# Patient Record
Sex: Female | Born: 2000
Health system: Southern US, Community
[De-identification: ages and names within clinical notes are randomized; demographics above are authoritative.]

## PROBLEM LIST (undated history)

## (undated) DIAGNOSIS — I781 Nevus, non-neoplastic: Secondary | ICD-10-CM

## (undated) DIAGNOSIS — J45909 Unspecified asthma, uncomplicated: Secondary | ICD-10-CM

## (undated) HISTORY — PX: TONSILLECTOMY: SUR1361

---
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
## 2019-01-08 ENCOUNTER — Encounter: Payer: Self-pay | Admitting: Physical Therapy

## 2019-01-08 ENCOUNTER — Ambulatory Visit: Payer: 59 | Attending: Neurology | Admitting: Physical Therapy

## 2019-01-08 ENCOUNTER — Other Ambulatory Visit: Payer: Self-pay

## 2019-01-08 DIAGNOSIS — R252 Cramp and spasm: Secondary | ICD-10-CM | POA: Diagnosis present

## 2019-01-08 DIAGNOSIS — G44311 Acute post-traumatic headache, intractable: Secondary | ICD-10-CM | POA: Insufficient documentation

## 2019-01-08 DIAGNOSIS — F0781 Postconcussional syndrome: Secondary | ICD-10-CM | POA: Diagnosis present

## 2019-01-08 DIAGNOSIS — R262 Difficulty in walking, not elsewhere classified: Secondary | ICD-10-CM | POA: Insufficient documentation

## 2019-01-08 DIAGNOSIS — M542 Cervicalgia: Secondary | ICD-10-CM | POA: Insufficient documentation

## 2019-01-08 NOTE — Therapy (Signed)
[place], [place], [address] Phone: [phone]   Fax:  [phone]  Physical Therapy Evaluation  Patient Details  Name: Candace King MRN: [id number] Date of Birth: Jun 09, 2000 Referring Provider (PT): Valaria Good   Encounter Date: 01/08/2019  PT End of Session - 01/08/19 1151    Visit Number  1    Date for PT Re-Evaluation  03/10/18    PT Start Time  1100    PT Stop Time  1200    PT Time Calculation (min)  60 min    Activity Tolerance  Patient tolerated treatment well    Behavior During Therapy  [facility] for tasks assessed/performed       Past Medical History:  Diagnosis Date  . Asthma   . Telangiectasia     Past Surgical History:  Procedure Laterality Date  . TONSILLECTOMY      There were no vitals filed for this visit.   Subjective Assessment - 01/08/19 1109    Subjective  Patient reports two falls when she slipped and fell, one time she hit her head and the next day a sheet of ice fell on her head, she reports had a CT scan and will have an MRI on 01/13/19.  She reports that she has neck pain, HA and dizziness.  She reports that it is worse with bending forward.  She reports that she had some photosensitivity and some loud noise aversion.    Limitations  Walking;House hold activities    Patient Stated Goals  walk better, less neck pain and HA, less dizziness    Currently in Pain?  Yes    Pain Score  7     Pain Location  Neck    Pain Orientation  Left    Pain Descriptors / Indicators  Aching;Sharp    Pain Type  Acute pain    Pain Radiating Towards  patient does describe HA frontal and parietal area, c/o pressure and tightness    Pain Onset  More than a month ago    Pain Frequency  Constant    Aggravating Factors   constant pain, bright light, noise, bending over, movements of the head, pain can be 9/10    Pain Relieving Factors  dark room, rest, lie down, tylenol  at best pain a 5/10     Effect of Pain on Daily Activities  difficulty walking, overwhelmed easily, difficulty concentrating         [facility] PT Assessment - 01/08/19 0001      Assessment   Medical Diagnosis  neck pain, post concussion syndrome    Referring Provider (PT)  Tonuzzi    Onset Date/Surgical Date  12/05/18    Hand Dominance  Right    Prior Therapy  no      Precautions   Precautions  None      Balance Screen   Has the patient fallen in the past 6 months  Yes    How many times?  2    Has the patient had a decrease in activity level because of a fear of falling?   Yes    Is the patient reluctant to leave their home because of a fear of falling?   Yes      Home Environment   Additional Comments  has stairs, does some housework      Prior Function   Level of Independence  Independent with community mobility with device    Vocation  Student    Leisure  some exercise      Posture/Postural Control   Posture Comments  fwd head, rounded shoulders      ROM / Strength   AROM / PROM / Strength  AROM;Strength      AROM   Overall AROM Comments  Cervical ROM is very tight and decreaed 75% with c/o neck pain and HA, shoulder ROM limited with pain inthe neck and the HA    AROM Assessment Site  Shoulder    Right/Left Shoulder  Right;Left    Right Shoulder Flexion  130 Degrees    Right Shoulder ABduction  120 Degrees    Right Shoulder Internal Rotation  30 Degrees    Right Shoulder External Rotation  50 Degrees    Left Shoulder Flexion  130 Degrees    Left Shoulder ABduction  120 Degrees    Left Shoulder Internal Rotation  30 Degrees    Left Shoulder External Rotation  50 Degrees      Strength   Overall Strength Comments  Elbows and shoulders 3+/5 with pain int he neck and increased HA, LE's 3+/5 with HA and neck pain      Flexibility   Soft Tissue Assessment /Muscle Length  yes    Hamstrings  tight    Piriformis  tight      Palpation   Palpation comment  very tight in the upper traps and  the cervical area and into the rhomboids,she is tender in these areas as well      Transfers   Comments  unable to stand without using her hands      Ambulation/Gait   Gait Comments  using a SPC, slow and seems to have some ataxic motions of the LE's, they report that she was having no issues with walking prior to the fall and the hit to her head                Objective measurements completed on examination: See above findings.      [facility] Adult PT Treatment/Exercise - 01/08/19 0001      Modalities   Modalities  Cryotherapy;Electrical Stimulation;Moist Heat      Moist Heat Therapy   Number Minutes Moist Heat  10 Minutes    Moist Heat Location  Cervical      Cryotherapy   Number Minutes Cryotherapy  10 Minutes    Cryotherapy Location  Other (comment)   back of head   Type of Cryotherapy  Ice pack      Electrical Stimulation   Electrical Stimulation Location  to the upper traps and into the cervical area    Electrical Stimulation Action  IFC    Electrical Stimulation Parameters  supine    Electrical Stimulation Goals  Pain             PT Education - 01/08/19 1150    Education Details  cervical and scapular retraction, shrugs and gentle upper trap streches    Person(s) Educated  Patient;Parent(s)    Methods  Explanation;Demonstration;Tactile cues;Verbal cues;Handout    Comprehension  Verbalized understanding       PT Short Term Goals - 01/08/19 1231      PT SHORT TERM GOAL #1   Title  independent with initial HEP    Time  2    Period  Weeks    Status  New        PT Long Term Goals - 01/08/19 1231      PT LONG  TERM GOAL #1   Title  decrease pain 50%    Time  8    Period  Weeks    Status  New      PT LONG TERM GOAL #2   Title  increase cervical ROM 50%    Time  8    Period  Weeks    Status  New      PT LONG TERM GOAL #3   Title  decrease neck pain 50%    Time  8    Period  Weeks    Status  New      PT LONG TERM GOAL #4   Title   decrease HA frequency 50%    Time  8    Period  Weeks    Status  New      PT LONG TERM GOAL #5   Title  walk without device wihtout deviation    Time  8    Period  Weeks    Status  New             Plan - 01/08/19 1151    Clinical Impression Statement  Patient reports that she had two falls one where she hit her head and then ice fell and hit her head in a two day period 12/05/18.  She reports that she has had HA, neck pain, dizziness, light and sound sensitivity, difficulty concentrating and difficulty walking due to very weak legs since that time.  She had to use hands to stand up, she walks with a SPC very uncoordinated LE's, she is very limited in cervical and shoulder ROM with increase neck pain and HA symptoms.  MMT is weak but also caused increased HA.    Stability/Clinical Decision Making  Evolving/Moderate complexity    Clinical Decision Making  Moderate    Rehab Potential  Good    PT Frequency  2x / week    PT Duration  8 weeks    PT Treatment/Interventions  ADLs/Self Care Home Management;Cryotherapy;Electrical Stimulation;Moist Heat;Traction;Ultrasound;Therapeutic activities;Therapeutic exercise;Balance training;Neuromuscular re-education;Manual techniques;Patient/family education;Dry needling    PT Next Visit Plan  patietn has post concussive syndrome, will need to go slow with any exercises, watch her HR to see if this causes her symptoms as well    Consulted and Agree with Plan of Care  Patient       Patient will benefit from skilled therapeutic intervention in order to improve the following deficits and impairments:  Abnormal gait, Dizziness, Improper body mechanics, Pain, Decreased mobility, Increased muscle spasms, Postural dysfunction, Decreased activity tolerance, Decreased endurance, Decreased range of motion, Decreased strength, Impaired UE functional use, Decreased balance, Difficulty walking, Impaired flexibility  Visit Diagnosis: Cervicalgia - Plan: PT plan  of care cert/re-cert  Intractable acute post-traumatic headache - Plan: PT plan of care cert/re-cert  Cramp and spasm - Plan: PT plan of care cert/re-cert  Difficulty in walking, not elsewhere classified - Plan: PT plan of care cert/re-cert  Post concussion syndrome - Plan: PT plan of care cert/re-cert     Problem List There are no active problems to display for this patient.   Sumner Boast., PT 01/08/2019, 12:35 PM  [place] [address] [place], [place], [address] Phone: [phone]   Fax:  [phone]  Name: Candace King MRN: [id number] Date of Birth: Jun 24, 2000

## 2019-01-14 ENCOUNTER — Ambulatory Visit: Payer: 59 | Admitting: Physical Therapy

## 2019-01-14 ENCOUNTER — Other Ambulatory Visit: Payer: Self-pay

## 2019-01-14 DIAGNOSIS — M542 Cervicalgia: Secondary | ICD-10-CM | POA: Diagnosis not present

## 2019-01-14 DIAGNOSIS — R262 Difficulty in walking, not elsewhere classified: Secondary | ICD-10-CM

## 2019-01-14 NOTE — Therapy (Signed)
[place], [place], [address] Phone: [phone]   Fax:  [phone]  Physical Therapy Treatment  Patient Details  Name: Candace King MRN: [id number] Date of Birth: 07-22-2000 Referring Provider (PT): Valaria Good   Encounter Date: 01/14/2019  PT End of Session - 01/14/19 1649    Visit Number  2    Date for PT Re-Evaluation  03/10/18    PT Start Time  1602    PT Stop Time  1655    PT Time Calculation (min)  53 min       Past Medical History:  Diagnosis Date  . Asthma   . Telangiectasia     Past Surgical History:  Procedure Laterality Date  . TONSILLECTOMY      There were no vitals filed for this visit.  Subjective Assessment - 01/14/19 1605    Subjective  "doing alright"    Limitations  Walking;House hold activities    Patient Stated Goals  walk better, less neck pain and HA, less dizziness    Currently in Pain?  Yes    Pain Score  4     Pain Location  Head                       [facility] Adult PT Treatment/Exercise - 01/14/19 0001      Exercises   Exercises  Shoulder;Knee/Hip      Knee/Hip Exercises: Aerobic   Nustep  L3 x 1 min      Knee/Hip Exercises: Seated   Long Arc Quad  Strengthening;Both;10 reps    Other Seated Knee/Hip Exercises  marching x 10, no weight    Hamstring Curl  Strengthening;Both;5 reps    Hamstring Limitations  red theraband    Sit to Sand  1 set;with UE support      Shoulder Exercises: Seated   Row  Both;10 reps;Theraband    Theraband Level (Shoulder Row)  Level 2 (Red)      Moist Heat Therapy   Number Minutes Moist Heat  15 Minutes    Moist Heat Location  Cervical      Cryotherapy   Number Minutes Cryotherapy  10 Minutes    Cryotherapy Location  Other (comment)    Type of Cryotherapy  Ice pack      Electrical Stimulation   Electrical Stimulation Location  to the upper traps and into the cervical area    Electrical  Stimulation Action  IFC    Electrical Stimulation Parameters  supine    Electrical Stimulation Goals  Pain               PT Short Term Goals - 01/08/19 1231      PT SHORT TERM GOAL #1   Title  independent with initial HEP    Time  2    Period  Weeks    Status  New        PT Long Term Goals - 01/08/19 1231      PT LONG TERM GOAL #1   Title  decrease pain 50%    Time  8    Period  Weeks    Status  New      PT LONG TERM GOAL #2   Title  increase cervical ROM 50%    Time  8    Period  Weeks    Status  New      PT LONG TERM GOAL #3  Title  decrease neck pain 50%    Time  8    Period  Weeks    Status  New      PT LONG TERM GOAL #4   Title  decrease HA frequency 50%    Time  8    Period  Weeks    Status  New      PT LONG TERM GOAL #5   Title  walk without device wihtout deviation    Time  8    Period  Weeks    Status  New            Plan - 01/14/19 1644    Clinical Impression Statement  pt' HR was 86 bpm at the start of the session. pt attempted to warm up on nustep. she had to stop due to dizziness (HR was 94). pt able to complete LAQ, HS, and marching without weights, however they all gave her a headache. she is pt unable to complete STS due to dizziness. pt attempted rows but was unable to finish due to complaints of pain.    Stability/Clinical Decision Making  Evolving/Moderate complexity    Rehab Potential  Good    PT Frequency  2x / week    PT Duration  8 weeks    PT Treatment/Interventions  ADLs/Self Care Home Management;Cryotherapy;Electrical Stimulation;Moist Heat;Traction;Ultrasound;Therapeutic activities;Therapeutic exercise;Balance training;Neuromuscular re-education;Manual techniques;Patient/family education;Dry needling    PT Next Visit Plan  patietn has post concussive syndrome, will need to go slow with any exercises, watch her HR to see if this causes her symptoms as well    Consulted and Agree with Plan of Care  Patient        Patient will benefit from skilled therapeutic intervention in order to improve the following deficits and impairments:  Abnormal gait, Dizziness, Improper body mechanics, Pain, Decreased mobility, Increased muscle spasms, Postural dysfunction, Decreased activity tolerance, Decreased endurance, Decreased range of motion, Decreased strength, Impaired UE functional use, Decreased balance, Difficulty walking, Impaired flexibility  Visit Diagnosis: Cervicalgia  Difficulty in walking, not elsewhere classified     Problem List There are no active problems to display for this patient.   [address], [place] 01/14/2019, 4:51 PM  [place], [place], [address] Phone: [phone]   Fax:  [phone]  Name: Candace King MRN: [id number] Date of Birth: 12/14/2000

## 2019-01-15 ENCOUNTER — Ambulatory Visit: Payer: 59 | Admitting: Physical Therapy

## 2019-01-15 DIAGNOSIS — M542 Cervicalgia: Secondary | ICD-10-CM | POA: Diagnosis not present

## 2019-01-15 DIAGNOSIS — R252 Cramp and spasm: Secondary | ICD-10-CM

## 2019-01-15 NOTE — Therapy (Signed)
[place], [place], [address] Phone: [phone]   Fax:  [phone]  Physical Therapy Treatment  Patient Details  Name: Candace King MRN: [id number] Date of Birth: 29-Jan-2001 Referring Provider (PT): Valaria Good   Encounter Date: 01/15/2019  PT End of Session - 01/15/19 1739    Visit Number  3    Date for PT Re-Evaluation  03/10/18    PT Start Time  1700    PT Stop Time  [id number]    PT Time Calculation (min)  53 min       Past Medical History:  Diagnosis Date  . Asthma   . Telangiectasia     Past Surgical History:  Procedure Laterality Date  . TONSILLECTOMY      There were no vitals filed for this visit.  Subjective Assessment - 01/15/19 1701    Subjective  "doing good, a little tired"    Limitations  Walking;House hold activities    Patient Stated Goals  walk better, less neck pain and HA, less dizziness    Currently in Pain?  Yes    Pain Score  5     Pain Location  Head    Pain Orientation  Anterior                       [facility] Adult PT Treatment/Exercise - 01/15/19 0001      Knee/Hip Exercises: Supine   Quad Sets  Strengthening;Both;5 reps    Heel Slides  Strengthening;Both;5 reps    Straight Leg Raises  Strengthening;Both;5 reps    Other Supine Knee/Hip Exercises  K2C and LTR 2 x 10      Moist Heat Therapy   Number Minutes Moist Heat  15 Minutes    Moist Heat Location  Cervical      Cryotherapy   Number Minutes Cryotherapy  10 Minutes    Cryotherapy Location  Other (comment)    Type of Cryotherapy  Ice pack      Electrical Stimulation   Electrical Stimulation Location  to the upper traps and into the cervical area    Electrical Stimulation Action  IFC    Electrical Stimulation Parameters  supine    Electrical Stimulation Goals  Pain             PT Education - 01/15/19 1742    Education Details  given information on TENS    Person(s)  Educated  Patient;Parent(s)    Methods  Explanation;Handout    Comprehension  Verbalized understanding       PT Short Term Goals - 01/15/19 1741      PT SHORT TERM GOAL #1   Title  independent with initial HEP    Status  Achieved        PT Long Term Goals - 01/15/19 1741      PT LONG TERM GOAL #1   Title  decrease pain 50%    Status  On-going      PT LONG TERM GOAL #2   Title  increase cervical ROM 50%    Status  On-going      PT LONG TERM GOAL #3   Title  decrease neck pain 50%    Status  On-going      PT LONG TERM GOAL #4   Title  decrease HA frequency 50%    Status  On-going      PT LONG TERM GOAL #5  Title  walk without device wihtout deviation    Status  On-going            Plan - 01/15/19 1742    Clinical Impression Statement  pt did SLR on both legs with limited motion due to weakness. pt states her head hurts with STR and quad sets. pt has less headache pain with LTR and K2C. pt and mother were given information of TENS for home. checking on coverage from insurance. pt needs frequent breaks due to pain. resting seems to allivate pain for a short time.    Stability/Clinical Decision Making  Evolving/Moderate complexity    Rehab Potential  Good    PT Frequency  2x / week    PT Duration  8 weeks    PT Treatment/Interventions  ADLs/Self Care Home Management;Cryotherapy;Electrical Stimulation;Moist Heat;Traction;Ultrasound;Therapeutic activities;Therapeutic exercise;Balance training;Neuromuscular re-education;Manual techniques;Patient/family education;Dry needling    PT Next Visit Plan  patietn has post concussive syndrome, will need to go slow with any exercises, watch her HR to see if this causes her symptoms as well       Patient will benefit from skilled therapeutic intervention in order to improve the following deficits and impairments:  Abnormal gait, Dizziness, Improper body mechanics, Pain, Decreased mobility, Increased muscle spasms, Postural  dysfunction, Decreased activity tolerance, Decreased endurance, Decreased range of motion, Decreased strength, Impaired UE functional use, Decreased balance, Difficulty walking, Impaired flexibility  Visit Diagnosis: Cervicalgia  Cramp and spasm     Problem List There are no problems to display for this patient.   [address], [place] 01/15/2019, 5:47 PM  [place], [place], [address] Phone: [phone]   Fax:  [phone]  Name: Candace King MRN: [id number] Date of Birth: 07-18-2000

## 2019-01-20 ENCOUNTER — Encounter: Payer: Self-pay | Admitting: Physical Therapy

## 2019-01-20 ENCOUNTER — Other Ambulatory Visit: Payer: Self-pay

## 2019-01-20 ENCOUNTER — Ambulatory Visit: Payer: 59 | Admitting: Physical Therapy

## 2019-01-20 DIAGNOSIS — M542 Cervicalgia: Secondary | ICD-10-CM

## 2019-01-20 DIAGNOSIS — F0781 Postconcussional syndrome: Secondary | ICD-10-CM

## 2019-01-20 DIAGNOSIS — R252 Cramp and spasm: Secondary | ICD-10-CM

## 2019-01-20 DIAGNOSIS — R262 Difficulty in walking, not elsewhere classified: Secondary | ICD-10-CM

## 2019-01-20 DIAGNOSIS — G44311 Acute post-traumatic headache, intractable: Secondary | ICD-10-CM

## 2019-01-20 NOTE — Therapy (Signed)
[place], [place], [address] Phone: [phone]   Fax:  [phone]  Physical Therapy Treatment  Patient Details  Name: Candace King MRN: [id number] Date of Birth: [birth year] Referring Provider (PT): Valaria Good   Encounter Date: 01/20/2019  PT End of Session - 01/20/19 1142    Visit Number  4    Date for PT Re-Evaluation  03/10/18    PT Start Time  1104    PT Stop Time  [id number]    PT Time Calculation (min)  48 min    Activity Tolerance  Patient limited by pain;Patient limited by fatigue    Behavior During Therapy  [facility] for tasks assessed/performed       Past Medical History:  Diagnosis Date  . Asthma   . Telangiectasia     Past Surgical History:  Procedure Laterality Date  . TONSILLECTOMY      There were no vitals filed for this visit.  Subjective Assessment - 01/20/19 1112    Subjective  I am feeling a little better, just worse as the day goes on.    Currently in Pain?  Yes    Pain Score  4     Pain Location  Head    Pain Descriptors / Indicators  Aching    Aggravating Factors   moving, reports that her pain can be a 2/10 with rest, but getting on to our mat table will increase pain and sitting wihtout back supported will increase the pain                       [facility] Adult PT Treatment/Exercise - 01/20/19 0001      Knee/Hip Exercises: Aerobic   Other Aerobic  UBE level 1 x 2 minutes      Knee/Hip Exercises: Seated   Ball Squeeze  15 reps      Knee/Hip Exercises: Supine   Quad Sets  Both;2 sets;10 reps    Short Arc Quad Sets  Both;2 sets;10 reps    Other Supine Knee/Hip Exercises  feet on ball K2C, trunk rotation, small bridge, small isometric abs      Shoulder Exercises: Supine   External Rotation  Both;10 reps;Theraband    Theraband Level (Shoulder External Rotation)  Level 1 (Yellow)      Moist Heat Therapy   Number Minutes Moist Heat  15 Minutes    Moist Heat Location  Cervical      Cryotherapy   Number Minutes Cryotherapy  10 Minutes    Cryotherapy Location  Other (comment)   head   Type of Cryotherapy  Ice pack      Electrical Stimulation   Electrical Stimulation Location  to the upper traps and into the cervical area    Electrical Stimulation Action  IFC    Electrical Stimulation Parameters  supine    Electrical Stimulation Goals  Pain               PT Short Term Goals - 01/15/19 1741      PT SHORT TERM GOAL #1   Title  independent with initial HEP    Status  Achieved        PT Long Term Goals - 01/20/19 1145      PT LONG TERM GOAL #1   Title  decrease pain 50%    Status  On-going      PT LONG TERM GOAL #2   Title  increase cervical ROM 50%    Status  On-going            Plan - 01/20/19 1143    Clinical Impression Statement  Patient walked in looking better and reported less pain, once she started moving she reported some increased pain and some increased dizziness, most exercises were her in supine.    PT Next Visit Plan  continue to try to work on strength, function and tolerance to activity    Consulted and Agree with Plan of Care  Patient       Patient will benefit from skilled therapeutic intervention in order to improve the following deficits and impairments:  Abnormal gait, Dizziness, Improper body mechanics, Pain, Decreased mobility, Increased muscle spasms, Postural dysfunction, Decreased activity tolerance, Decreased endurance, Decreased range of motion, Decreased strength, Impaired UE functional use, Decreased balance, Difficulty walking, Impaired flexibility  Visit Diagnosis: Cervicalgia  Cramp and spasm  Difficulty in walking, not elsewhere classified  Intractable acute post-traumatic headache  Post concussion syndrome     Problem List There are no problems to display for this patient.   Sumner Boast., PT 01/20/2019, 11:45 AM  [place], [place], [address] Phone: [phone]   Fax:  [phone]  Name: Candace King MRN: [id number] Date of Birth: November 02, 2000

## 2019-01-22 ENCOUNTER — Ambulatory Visit: Payer: 59 | Admitting: Physical Therapy

## 2019-01-22 ENCOUNTER — Other Ambulatory Visit: Payer: Self-pay

## 2019-01-22 DIAGNOSIS — M542 Cervicalgia: Secondary | ICD-10-CM | POA: Diagnosis not present

## 2019-01-22 DIAGNOSIS — R252 Cramp and spasm: Secondary | ICD-10-CM

## 2019-01-22 NOTE — Therapy (Signed)
[place], [place], [address] Phone: [phone]   Fax:  [phone]  Physical Therapy Treatment  Patient Details  Name: Candace King MRN: [id number] Date of Birth: 07-17-2000 Referring Provider (PT): Valaria Good   Encounter Date: 01/22/2019  PT End of Session - 01/22/19 1138    Visit Number  5    Date for PT Re-Evaluation  03/10/18    PT Start Time  1100    PT Stop Time  1150    PT Time Calculation (min)  50 min       Past Medical History:  Diagnosis Date  . Asthma   . Telangiectasia     Past Surgical History:  Procedure Laterality Date  . TONSILLECTOMY      There were no vitals filed for this visit.  Subjective Assessment - 01/22/19 1103    Subjective  pt states she is feeling more fatigued lately, but feels her headaches are decreasing by 10%.    Limitations  Walking;House hold activities    Currently in Pain?  Yes    Pain Score  3     Pain Location  Head    Pain Orientation  Anterior                       [facility] Adult PT Treatment/Exercise - 01/22/19 0001      Knee/Hip Exercises: Supine   Other Supine Knee/Hip Exercises  clam shells and ball squeeze 2 x 10    Other Supine Knee/Hip Exercises  feet on ball K2C, trunk rotation      Moist Heat Therapy   Number Minutes Moist Heat  15 Minutes    Moist Heat Location  Cervical      Cryotherapy   Number Minutes Cryotherapy  10 Minutes    Cryotherapy Location  Other (comment)    Type of Cryotherapy  Ice pack      Electrical Stimulation   Electrical Stimulation Location  to the upper traps and into the cervical area    Electrical Stimulation Action  IFC    Electrical Stimulation Parameters  supine    Electrical Stimulation Goals  Pain               PT Short Term Goals - 01/15/19 1741      PT SHORT TERM GOAL #1   Title  independent with initial HEP    Status  Achieved        PT Long Term  Goals - 01/20/19 1145      PT LONG TERM GOAL #1   Title  decrease pain 50%    Status  On-going      PT LONG TERM GOAL #2   Title  increase cervical ROM 50%    Status  On-going            Plan - 01/22/19 1139    Clinical Impression Statement  pt reports less pain today. exercises were done in supine due to increased headaches and dizziness when sitting and standing. pt states she is feeling stronger today. she has more motion with KTC. pt needs rest breaks due to increase of headache pain. cues are needed to improve form and technique of exercises.    Stability/Clinical Decision Making  Evolving/Moderate complexity    Rehab Potential  Good    PT Frequency  2x / week    PT Duration  8 weeks    PT Treatment/Interventions  ADLs/Self Care Home Management;Cryotherapy;Electrical Stimulation;Moist Heat;Traction;Ultrasound;Therapeutic activities;Therapeutic exercise;Balance training;Neuromuscular re-education;Manual techniques;Patient/family education;Dry needling    PT Next Visit Plan  continue to try to work on strength, function and tolerance to activity       Patient will benefit from skilled therapeutic intervention in order to improve the following deficits and impairments:  Abnormal gait, Dizziness, Improper body mechanics, Pain, Decreased mobility, Increased muscle spasms, Postural dysfunction, Decreased activity tolerance, Decreased endurance, Decreased range of motion, Decreased strength, Impaired UE functional use, Decreased balance, Difficulty walking, Impaired flexibility  Visit Diagnosis: Cervicalgia  Cramp and spasm     Problem List There are no problems to display for this patient.   [address], [place] 01/22/2019, 12:02 PM  [place], [place], [address] Phone: [phone]   Fax:  [phone]  Name: Candace King MRN: [id number] Date of Birth: 2000/12/22

## 2019-01-26 ENCOUNTER — Other Ambulatory Visit: Payer: Self-pay

## 2019-01-26 ENCOUNTER — Ambulatory Visit: Payer: 59 | Admitting: Physical Therapy

## 2019-01-26 ENCOUNTER — Encounter: Payer: Self-pay | Admitting: Physical Therapy

## 2019-01-26 DIAGNOSIS — M542 Cervicalgia: Secondary | ICD-10-CM

## 2019-01-26 DIAGNOSIS — F0781 Postconcussional syndrome: Secondary | ICD-10-CM

## 2019-01-26 DIAGNOSIS — R252 Cramp and spasm: Secondary | ICD-10-CM

## 2019-01-26 DIAGNOSIS — R262 Difficulty in walking, not elsewhere classified: Secondary | ICD-10-CM

## 2019-01-26 DIAGNOSIS — G44311 Acute post-traumatic headache, intractable: Secondary | ICD-10-CM

## 2019-01-26 NOTE — Therapy (Signed)
[place], [place], [address] Phone: [phone]   Fax:  [phone]  Physical Therapy Treatment  Patient Details  Name: Candace King MRN: [id number] Date of Birth: 28-Oct-2000 Referring Provider (PT): Valaria Good   Encounter Date: 01/26/2019  PT End of Session - 01/26/19 1100    Visit Number  6    Date for PT Re-Evaluation  03/10/18    PT Start Time  [id number]    PT Stop Time  1111    PT Time Calculation (min)  56 min    Activity Tolerance  Patient limited by pain;Patient limited by fatigue    Behavior During Therapy  [facility] for tasks assessed/performed       Past Medical History:  Diagnosis Date  . Asthma   . Telangiectasia     Past Surgical History:  Procedure Laterality Date  . TONSILLECTOMY      There were no vitals filed for this visit.  Subjective Assessment - 01/26/19 1020    Subjective  Patient is walking better today, reports that she wakes up with a HA.    Currently in Pain?  Yes    Pain Score  5     Pain Location  Head   both temples   Pain Descriptors / Indicators  Aching    Pain Radiating Towards  worse with activity                       [facility] Adult PT Treatment/Exercise - 01/26/19 0001      Ambulation/Gait   Gait Comments  walk without cane with CGA, going very slow, at 75 feet needed to sit, then returned      Knee/Hip Exercises: Seated   Long Arc Quad  Both;2 sets;15 reps    Long Arc Quad Weight  3 lbs.    Other Seated Knee/Hip Exercises  1# punches, 1# bicep curls    Marching  Both;2 sets;10 reps    Marching Weights  3 lbs.      Knee/Hip Exercises: Supine   Other Supine Knee/Hip Exercises  clam shells and ball squeeze 2 x 10    Other Supine Knee/Hip Exercises  feet on ball K2C, trunk rotation      Shoulder Exercises: Supine   Other Supine Exercises  chest press with pool noodle      Moist Heat Therapy   Number Minutes Moist Heat  10 Minutes     Moist Heat Location  Cervical      Cryotherapy   Number Minutes Cryotherapy  10 Minutes    Cryotherapy Location  Other (comment)   occipital area   Type of Cryotherapy  Ice pack      Electrical Stimulation   Electrical Stimulation Location  to the upper traps and into the cervical area    Electrical Stimulation Action  IFC    Electrical Stimulation Parameters  supine    Electrical Stimulation Goals  Pain      Manual Therapy   Manual Therapy  Manual Traction    Manual Traction  gentle manual occipital traction, gentle shoulder depression manually for stretches               PT Short Term Goals - 01/15/19 1741      PT SHORT TERM GOAL #1   Title  independent with initial HEP    Status  Achieved        PT Long Term Goals -  01/26/19 1102      PT LONG TERM GOAL #1   Title  decrease pain 50%    Status  On-going      PT LONG TERM GOAL #2   Title  increase cervical ROM 50%    Status  On-going      PT LONG TERM GOAL #5   Title  walk without device wihtout deviation    Status  On-going            Plan - 01/26/19 1100    Clinical Impression Statement  All exercises were done in a dark room to not exacerbate the symptoms of HA.  She is walking better, still fatigues very easily and reports activity does increase her HA.  at rest HR was 80, with activity HR up to 114 and this would increase the HA.    PT Next Visit Plan  add activities as she tolerates    Consulted and Agree with Plan of Care  Patient       Patient will benefit from skilled therapeutic intervention in order to improve the following deficits and impairments:  Abnormal gait, Dizziness, Improper body mechanics, Pain, Decreased mobility, Increased muscle spasms, Postural dysfunction, Decreased activity tolerance, Decreased endurance, Decreased range of motion, Decreased strength, Impaired UE functional use, Decreased balance, Difficulty walking, Impaired flexibility  Visit  Diagnosis: Cervicalgia  Cramp and spasm  Difficulty in walking, not elsewhere classified  Intractable acute post-traumatic headache  Post concussion syndrome     Problem List There are no problems to display for this patient.   Sumner Boast., PT 01/26/2019, 11:03 AM  [place], [place], [address] Phone: [phone]   Fax:  [phone]  Name: LAASIA CARMOSINO MRN: [id number] Date of Birth: 09/21/2000

## 2019-01-28 ENCOUNTER — Ambulatory Visit: Payer: 59 | Admitting: Physical Therapy

## 2019-01-28 ENCOUNTER — Other Ambulatory Visit: Payer: Self-pay

## 2019-01-28 ENCOUNTER — Encounter: Payer: Self-pay | Admitting: Physical Therapy

## 2019-01-28 DIAGNOSIS — F0781 Postconcussional syndrome: Secondary | ICD-10-CM

## 2019-01-28 DIAGNOSIS — G44311 Acute post-traumatic headache, intractable: Secondary | ICD-10-CM

## 2019-01-28 DIAGNOSIS — R252 Cramp and spasm: Secondary | ICD-10-CM

## 2019-01-28 DIAGNOSIS — R262 Difficulty in walking, not elsewhere classified: Secondary | ICD-10-CM

## 2019-01-28 DIAGNOSIS — M542 Cervicalgia: Secondary | ICD-10-CM

## 2019-01-28 NOTE — Therapy (Signed)
[place], [place], [address] Phone: [phone]   Fax:  [phone]  Physical Therapy Treatment  Patient Details  Name: Candace King MRN: [id number] Date of Birth: 11-05-2000 Referring Provider (PT): Valaria Good   Encounter Date: 01/28/2019  PT End of Session - 01/28/19 1139    Visit Number  7    Date for PT Re-Evaluation  03/10/18    PT Start Time  1103    PT Stop Time  1149    PT Time Calculation (min)  46 min    Activity Tolerance  Patient limited by pain;Patient limited by fatigue    Behavior During Therapy  [facility] for tasks assessed/performed       Past Medical History:  Diagnosis Date  . Asthma   . Telangiectasia     Past Surgical History:  Procedure Laterality Date  . TONSILLECTOMY      There were no vitals filed for this visit.  Subjective Assessment - 01/28/19 1113    Subjective  Patient reports that she is starting to feel a little more normal, still gets dizzy every day    Currently in Pain?  Yes    Pain Score  4     Pain Location  Head                       [facility] Adult PT Treatment/Exercise - 01/28/19 0001      Ambulation/Gait   Gait Comments  prior to walk HR was 82bpm, after walking 120 feet her HR goes up to 116bpm and she will have some increased HA with this      Knee/Hip Exercises: Supine   Other Supine Knee/Hip Exercises  clam shells and ball squeeze 2 x 10    Other Supine Knee/Hip Exercises  feet on ball K2C, trunk rotation      Shoulder Exercises: Standing   Other Standing Exercises  shoulder shrugs and circles      Shoulder Exercises: ROM/Strengthening   UBE (Upper Arm Bike)  level 1 x 2 minutes and then had increased pain      Moist Heat Therapy   Number Minutes Moist Heat  10 Minutes    Moist Heat Location  Cervical      Cryotherapy   Cryotherapy Location  Other (comment)   occipital area   Type of Cryotherapy  Ice pack       Electrical Stimulation   Electrical Stimulation Location  to the upper traps and into the cervical area    Electrical Stimulation Action  IFC    Electrical Stimulation Parameters  supine    Electrical Stimulation Goals  Pain      Manual Therapy   Manual Therapy  Soft tissue mobilization;Manual Traction    Soft tissue mobilization  gentle to the cervical parapsinals and into the upper traps    Manual Traction  gentle manual occipital traction, gentle shoulder depression manually for stretches               PT Short Term Goals - 01/15/19 1741      PT SHORT TERM GOAL #1   Title  independent with initial HEP    Status  Achieved        PT Long Term Goals - 01/28/19 1141      PT LONG TERM GOAL #1   Title  decrease pain 50%    Status  On-going  PT LONG TERM GOAL #5   Title  walk without device wihtout deviation    Status  On-going            Plan - 01/28/19 1140    Clinical Impression Statement  Patient is walking and moving faster and easier, she does fatigue quickly and will start to get the HA when her HR gets to 110 bpm, she is still very tight in the upper traps and tends to elevate her shoulders and needs cues to relax    PT Next Visit Plan  continue to add as she tolerates , just monitor her symptoms    Consulted and Agree with Plan of Care  Patient       Patient will benefit from skilled therapeutic intervention in order to improve the following deficits and impairments:  Abnormal gait, Dizziness, Improper body mechanics, Pain, Decreased mobility, Increased muscle spasms, Postural dysfunction, Decreased activity tolerance, Decreased endurance, Decreased range of motion, Decreased strength, Impaired UE functional use, Decreased balance, Difficulty walking, Impaired flexibility  Visit Diagnosis: Cervicalgia  Cramp and spasm  Difficulty in walking, not elsewhere classified  Intractable acute post-traumatic headache  Post concussion  syndrome     Problem List There are no problems to display for this patient.   Sumner Boast., PT 01/28/2019, 11:42 AM  [address] [place], [place], [address] Phone: [phone]   Fax:  [phone]  Name: Candace King MRN: [id number] Date of Birth: Sep 15, 2000

## 2019-02-02 ENCOUNTER — Encounter: Payer: Self-pay | Admitting: Physical Therapy

## 2019-02-02 ENCOUNTER — Other Ambulatory Visit: Payer: Self-pay

## 2019-02-02 ENCOUNTER — Ambulatory Visit: Payer: 59 | Admitting: Physical Therapy

## 2019-02-02 DIAGNOSIS — R252 Cramp and spasm: Secondary | ICD-10-CM

## 2019-02-02 DIAGNOSIS — M542 Cervicalgia: Secondary | ICD-10-CM | POA: Diagnosis not present

## 2019-02-02 DIAGNOSIS — R262 Difficulty in walking, not elsewhere classified: Secondary | ICD-10-CM

## 2019-02-02 DIAGNOSIS — G44311 Acute post-traumatic headache, intractable: Secondary | ICD-10-CM

## 2019-02-02 DIAGNOSIS — F0781 Postconcussional syndrome: Secondary | ICD-10-CM

## 2019-02-02 NOTE — Therapy (Signed)
[place], [place], [address] Phone: [phone]   Fax:  [phone]  Physical Therapy Treatment  Patient Details  Name: Candace King MRN: [id number] Date of Birth: 03-20-2000 Referring Provider (PT): Valaria Good   Encounter Date: 02/02/2019  PT End of Session - 02/02/19 1343    Visit Number  8    Date for PT Re-Evaluation  03/10/18    PT Start Time  1300    PT Stop Time  1357    PT Time Calculation (min)  57 min    Activity Tolerance  Patient limited by fatigue    Behavior During Therapy  [facility] for tasks assessed/performed       Past Medical History:  Diagnosis Date  . Asthma   . Telangiectasia     Past Surgical History:  Procedure Laterality Date  . TONSILLECTOMY      There were no vitals filed for this visit.  Subjective Assessment - 02/02/19 1300    Subjective  Still having dizziness, headaches are a little better, Really bad migraine christmas eve due to cold front. Memory is little better    Currently in Pain?  No/denies   no pain really just fatigue.                      [facility] Adult PT Treatment/Exercise - 02/02/19 0001      Ambulation/Gait   Gait Comments  prior to walk HR was 96bpm, after walking 90 feet her HR goes down to 80bpm and she will have some increased HA with this      High Level Balance   High Level Balance Activities  Side stepping      Knee/Hip Exercises: Aerobic   Nustep  L1 x 4 min   HR 117 aferwards     Knee/Hip Exercises: Seated   Long Arc Quad  Both;2 sets;15 reps    Long Arc Quad Weight  33 lbs.    Marching  Both;2 sets;10 reps    Marching Weights  3 lbs.    Hamstring Curl  Strengthening;Both;2 sets;10 reps    Hamstring Limitations  red theraband      Moist Heat Therapy   Number Minutes Moist Heat  15 Minutes    Moist Heat Location  Cervical      Cryotherapy   Number Minutes Cryotherapy  15 Minutes    Cryotherapy Location   Other (comment)   occipital area   Type of Cryotherapy  Ice pack      Electrical Stimulation   Electrical Stimulation Location  to the upper traps and into the cervical area    Electrical Stimulation Action  IFC    Electrical Stimulation Parameters  supine    Electrical Stimulation Goals  Pain               PT Short Term Goals - 01/15/19 1741      PT SHORT TERM GOAL #1   Title  independent with initial HEP    Status  Achieved        PT Long Term Goals - 01/28/19 1141      PT LONG TERM GOAL #1   Title  decrease pain 50%    Status  On-going      PT LONG TERM GOAL #5   Title  walk without device wihtout deviation    Status  On-going  Plan - 02/02/19 1344    Clinical Impression Statement  Pt reports that this was one of her better days. Cues during gait for reciprocal arm swing, pt would ambulate with stiff UE holding tension in upper traps. She was able to perform the NuStep for 4 minute without subjective reports of pain or dizziness.    Stability/Clinical Decision Making  Evolving/Moderate complexity    Rehab Potential  Good    PT Frequency  2x / week    PT Duration  8 weeks    PT Treatment/Interventions  ADLs/Self Care Home Management;Cryotherapy;Electrical Stimulation;Moist Heat;Traction;Ultrasound;Therapeutic activities;Therapeutic exercise;Balance training;Neuromuscular re-education;Manual techniques;Patient/family education;Dry needling    PT Next Visit Plan  continue to add as she tolerates , just monitor her symptoms       Patient will benefit from skilled therapeutic intervention in order to improve the following deficits and impairments:  Abnormal gait, Dizziness, Improper body mechanics, Pain, Decreased mobility, Increased muscle spasms, Postural dysfunction, Decreased activity tolerance, Decreased endurance, Decreased range of motion, Decreased strength, Impaired UE functional use, Decreased balance, Difficulty walking, Impaired  flexibility  Visit Diagnosis: Cervicalgia  Cramp and spasm  Difficulty in walking, not elsewhere classified  Intractable acute post-traumatic headache  Post concussion syndrome     Problem List There are no problems to display for this patient.   Scot Jun, PTA 02/02/2019, 1:47 PM  [place] [address] [place], [place], [address] Phone: [phone]   Fax:  [phone]  Name: Candace King MRN: [id number] Date of Birth: 2000/06/29

## 2019-02-03 ENCOUNTER — Other Ambulatory Visit: Payer: Self-pay | Admitting: Psychiatry

## 2019-02-03 NOTE — Telephone Encounter (Signed)
Not sure she's a patient of yours? This is dated 2019?

## 2019-02-04 ENCOUNTER — Encounter: Payer: Self-pay | Admitting: Physical Therapy

## 2019-02-04 ENCOUNTER — Ambulatory Visit: Payer: 59 | Admitting: Physical Therapy

## 2019-02-04 ENCOUNTER — Other Ambulatory Visit: Payer: Self-pay

## 2019-02-04 ENCOUNTER — Other Ambulatory Visit: Payer: Self-pay | Admitting: Psychiatry

## 2019-02-04 DIAGNOSIS — R252 Cramp and spasm: Secondary | ICD-10-CM

## 2019-02-04 DIAGNOSIS — M542 Cervicalgia: Secondary | ICD-10-CM

## 2019-02-04 DIAGNOSIS — F0781 Postconcussional syndrome: Secondary | ICD-10-CM

## 2019-02-04 DIAGNOSIS — R262 Difficulty in walking, not elsewhere classified: Secondary | ICD-10-CM

## 2019-02-04 DIAGNOSIS — G44311 Acute post-traumatic headache, intractable: Secondary | ICD-10-CM

## 2019-02-04 NOTE — Telephone Encounter (Signed)
Last seen over a year ago terminating care as [place] out of [place] cannot refill the controlled substance Xanax.

## 2019-02-04 NOTE — Telephone Encounter (Signed)
Controlled substance requested by patient not seen in over a year die to moving out of state and terminating care here in the process cannot be refilled without appointment.

## 2019-02-04 NOTE — Therapy (Signed)
[place] [address] [place], [place], [address] Phone: [phone]   Fax:  [phone]  Physical Therapy Treatment  Patient Details  Name: Candace King MRN: [id number] Date of Birth: Oct 17, 2000 Referring Provider (PT): Valaria Good   Encounter Date: 02/04/2019  PT End of Session - 02/04/19 [id number]    Visit Number  9    Date for PT Re-Evaluation  03/10/18    PT Start Time  1308    PT Stop Time  1402    PT Time Calculation (min)  54 min    Activity Tolerance  Patient limited by fatigue    Behavior During Therapy  [facility] for tasks assessed/performed       Past Medical History:  Diagnosis Date  . Asthma   . Telangiectasia     Past Surgical History:  Procedure Laterality Date  . TONSILLECTOMY      There were no vitals filed for this visit.  Subjective Assessment - 02/04/19 1308    Subjective  "Doing better, headaches have been about a 1 or 2" Still having some tightness in the neck and shoulders    Currently in Pain?  Yes    Pain Score  2     Pain Location  Head    Pain Descriptors / Indicators  Headache                       [facility] Adult PT Treatment/Exercise - 02/04/19 0001      Ambulation/Gait   Stairs  Yes    Stairs Assistance  5: Supervision    Stair Management Technique  One rail Left;Alternating pattern;Step to pattern    Number of Stairs  24    Height of Stairs  6    Gait Comments  cues to maintain  alt pattrn      Knee/Hip Exercises: Aerobic   Nustep  L1 x 4 min      Knee/Hip Exercises: Seated   Long Arc Quad  Both;2 sets;15 reps    Long Arc Quad Weight  3 lbs.    Hamstring Curl  Strengthening;Both;2 sets;10 reps    Hamstring Limitations  red theraband    Sit to Sand  2 sets;5 reps;without UE support;Other (comment)   From elevated bed      Moist Heat Therapy   Number Minutes Moist Heat  15 Minutes    Moist Heat Location  Cervical      Cryotherapy   Number Minutes  Cryotherapy  15 Minutes    Cryotherapy Location  Other (comment)   occipital area   Type of Cryotherapy  Ice pack      Electrical Stimulation   Electrical Stimulation Location  to the upper traps and into the cervical area    Electrical Stimulation Action  IFC    Electrical Stimulation Parameters  supine    Electrical Stimulation Goals  Pain               PT Short Term Goals - 01/15/19 1741      PT SHORT TERM GOAL #1   Title  independent with initial HEP    Status  Achieved        PT Long Term Goals - 01/28/19 1141      PT LONG TERM GOAL #1   Title  decrease pain 50%    Status  On-going      PT LONG TERM GOAL #5   Title  walk  without device wihtout deviation    Status  On-going            Plan - 02/04/19 1348    Clinical Impression Statement  Pt able to progress to stair negotiation. Initially pt started with a step to pattern but able to alternat with cues. She tolerated increase tin on NuStep but reported increase fatigue with ~ 15 seconds remaining. Added some seated rows, cues needed for posture, reports tat she could feel her muscles working.    Stability/Clinical Decision Making  Evolving/Moderate complexity    Rehab Potential  Good    PT Frequency  2x / week    PT Duration  8 weeks    PT Treatment/Interventions  ADLs/Self Care Home Management;Cryotherapy;Electrical Stimulation;Moist Heat;Traction;Ultrasound;Therapeutic activities;Therapeutic exercise;Balance training;Neuromuscular re-education;Manual techniques;Patient/family education;Dry needling    PT Next Visit Plan  continue to add as she tolerates , just monitor her symptoms       Patient will benefit from skilled therapeutic intervention in order to improve the following deficits and impairments:  Abnormal gait, Dizziness, Improper body mechanics, Pain, Decreased mobility, Increased muscle spasms, Postural dysfunction, Decreased activity tolerance, Decreased endurance, Decreased range of motion,  Decreased strength, Impaired UE functional use, Decreased balance, Difficulty walking, Impaired flexibility  Visit Diagnosis: Cramp and spasm  Difficulty in walking, not elsewhere classified  Intractable acute post-traumatic headache  Post concussion syndrome  Cervicalgia     Problem List There are no problems to display for this patient.   Scot Jun, PTA 02/04/2019, 1:50 PM  [place], [place], [address] Phone: [phone]   Fax:  [phone]  Name: Candace King MRN: [id number] Date of Birth: 2000/04/04

## 2019-02-11 ENCOUNTER — Ambulatory Visit: Payer: 59 | Admitting: Physical Therapy

## 2019-02-13 ENCOUNTER — Encounter: Payer: 59 | Admitting: Physical Therapy

## 2019-08-09 ENCOUNTER — Emergency Department (HOSPITAL_COMMUNITY): Payer: 59

## 2019-08-09 ENCOUNTER — Other Ambulatory Visit: Payer: Self-pay

## 2019-08-09 ENCOUNTER — Encounter (HOSPITAL_COMMUNITY): Payer: Self-pay | Admitting: Emergency Medicine

## 2019-08-09 ENCOUNTER — Inpatient Hospital Stay (HOSPITAL_COMMUNITY)
Admission: EM | Admit: 2019-08-09 | Discharge: 2019-08-16 | DRG: 392 | Disposition: A | Discharge status: Home / Self Care | Payer: 59 | Attending: Internal Medicine | Admitting: Internal Medicine

## 2019-08-09 DIAGNOSIS — Z818 Family history of other mental and behavioral disorders: Secondary | ICD-10-CM

## 2019-08-09 DIAGNOSIS — R109 Unspecified abdominal pain: Secondary | ICD-10-CM | POA: Diagnosis present

## 2019-08-09 DIAGNOSIS — F329 Major depressive disorder, single episode, unspecified: Secondary | ICD-10-CM | POA: Diagnosis present

## 2019-08-09 DIAGNOSIS — F603 Borderline personality disorder: Secondary | ICD-10-CM | POA: Diagnosis present

## 2019-08-09 DIAGNOSIS — E873 Alkalosis: Secondary | ICD-10-CM | POA: Diagnosis present

## 2019-08-09 DIAGNOSIS — R000 Tachycardia, unspecified: Secondary | ICD-10-CM | POA: Diagnosis present

## 2019-08-09 DIAGNOSIS — K580 Irritable bowel syndrome with diarrhea: Principal | ICD-10-CM | POA: Diagnosis present

## 2019-08-09 DIAGNOSIS — K219 Gastro-esophageal reflux disease without esophagitis: Secondary | ICD-10-CM | POA: Diagnosis present

## 2019-08-09 DIAGNOSIS — N898 Other specified noninflammatory disorders of vagina: Secondary | ICD-10-CM | POA: Diagnosis present

## 2019-08-09 DIAGNOSIS — Z20822 Contact with and (suspected) exposure to covid-19: Secondary | ICD-10-CM | POA: Diagnosis present

## 2019-08-09 DIAGNOSIS — E872 Acidosis: Secondary | ICD-10-CM | POA: Diagnosis present

## 2019-08-09 DIAGNOSIS — Z975 Presence of (intrauterine) contraceptive device: Secondary | ICD-10-CM

## 2019-08-09 DIAGNOSIS — Z79899 Other long term (current) drug therapy: Secondary | ICD-10-CM

## 2019-08-09 DIAGNOSIS — Z800 Family history of malignant neoplasm of digestive organs: Secondary | ICD-10-CM

## 2019-08-09 DIAGNOSIS — Z803 Family history of malignant neoplasm of breast: Secondary | ICD-10-CM

## 2019-08-09 DIAGNOSIS — F419 Anxiety disorder, unspecified: Secondary | ICD-10-CM | POA: Diagnosis not present

## 2019-08-09 DIAGNOSIS — R1084 Generalized abdominal pain: Secondary | ICD-10-CM

## 2019-08-09 DIAGNOSIS — F32A Depression, unspecified: Secondary | ICD-10-CM | POA: Diagnosis present

## 2019-08-09 DIAGNOSIS — G8929 Other chronic pain: Secondary | ICD-10-CM | POA: Diagnosis present

## 2019-08-09 DIAGNOSIS — F410 Panic disorder [episodic paroxysmal anxiety] without agoraphobia: Secondary | ICD-10-CM | POA: Diagnosis present

## 2019-08-09 DIAGNOSIS — G43909 Migraine, unspecified, not intractable, without status migrainosus: Secondary | ICD-10-CM | POA: Diagnosis present

## 2019-08-09 DIAGNOSIS — J4541 Moderate persistent asthma with (acute) exacerbation: Secondary | ICD-10-CM | POA: Diagnosis present

## 2019-08-09 DIAGNOSIS — N9489 Other specified conditions associated with female genital organs and menstrual cycle: Secondary | ICD-10-CM | POA: Diagnosis present

## 2019-08-09 DIAGNOSIS — K6389 Other specified diseases of intestine: Secondary | ICD-10-CM | POA: Diagnosis present

## 2019-08-09 DIAGNOSIS — J45909 Unspecified asthma, uncomplicated: Secondary | ICD-10-CM | POA: Diagnosis present

## 2019-08-09 DIAGNOSIS — R0600 Dyspnea, unspecified: Secondary | ICD-10-CM

## 2019-08-09 DIAGNOSIS — E876 Hypokalemia: Secondary | ICD-10-CM | POA: Diagnosis present

## 2019-08-09 DIAGNOSIS — K649 Unspecified hemorrhoids: Secondary | ICD-10-CM | POA: Diagnosis present

## 2019-08-09 DIAGNOSIS — Q438 Other specified congenital malformations of intestine: Secondary | ICD-10-CM

## 2019-08-09 LAB — CBC WITH DIFFERENTIAL/PLATELET
Abs Immature Granulocytes: 0.02 10*3/uL (ref 0.00–0.07)
Basophils Absolute: 0 10*3/uL (ref 0.0–0.1)
Basophils Relative: 1 %
Eosinophils Absolute: 0 10*3/uL (ref 0.0–0.5)
Eosinophils Relative: 1 %
HCT: 44.7 % (ref 36.0–46.0)
Hemoglobin: 15.5 g/dL — ABNORMAL HIGH (ref 12.0–15.0)
Immature Granulocytes: 0 %
Lymphocytes Relative: 36 %
Lymphs Abs: 2.4 10*3/uL (ref 0.7–4.0)
MCH: 32.1 pg (ref 26.0–34.0)
MCHC: 34.7 g/dL (ref 30.0–36.0)
MCV: 92.5 fL (ref 80.0–100.0)
Monocytes Absolute: 0.5 10*3/uL (ref 0.1–1.0)
Monocytes Relative: 8 %
Neutro Abs: 3.6 10*3/uL (ref 1.7–7.7)
Neutrophils Relative %: 54 %
Platelets: 259 10*3/uL (ref 150–400)
RBC: 4.83 MIL/uL (ref 3.87–5.11)
RDW: 11.6 % (ref 11.5–15.5)
WBC: 6.6 10*3/uL (ref 4.0–10.5)
nRBC: 0 % (ref 0.0–0.2)

## 2019-08-09 LAB — I-STAT BETA HCG BLOOD, ED (MC, WL, AP ONLY): I-stat hCG, quantitative: <5 m[IU]/mL (ref <5)

## 2019-08-09 LAB — BASIC METABOLIC PANEL
Anion gap: 18 — ABNORMAL HIGH (ref 5–15)
BUN: 6 mg/dL (ref 6–20)
CO2: 14 mmol/L — ABNORMAL LOW (ref 22–32)
Calcium: 9.7 mg/dL (ref 8.9–10.3)
Chloride: 108 mmol/L (ref 98–111)
Creatinine, Ser: 0.91 mg/dL (ref 0.44–1.00)
GFR calc Af Amer: >60 mL/min (ref >60)
GFR calc non Af Amer: >60 mL/min (ref >60)
Glucose, Bld: 117 mg/dL — ABNORMAL HIGH (ref 70–99)
Potassium: 3.1 mmol/L — ABNORMAL LOW (ref 3.5–5.1)
Sodium: 140 mmol/L (ref 135–145)

## 2019-08-09 LAB — GROUP A STREP BY PCR: Group A Strep by PCR: NOT DETECTED

## 2019-08-09 LAB — SARS CORONAVIRUS 2 BY RT PCR (HOSPITAL ORDER, PERFORMED IN ~~LOC~~ HOSPITAL LAB): SARS Coronavirus 2: NEGATIVE

## 2019-08-09 LAB — D-DIMER, QUANTITATIVE (NOT AT ARMC): D-Dimer, Quant: 0.4 ug/mL-FEU (ref 0.00–0.50)

## 2019-08-09 MED ORDER — ACETAMINOPHEN 325 MG PO TABS
650.0000 mg | ORAL_TABLET | Freq: Once | ORAL | Status: AC
  Administered 2019-08-09: 650 mg via ORAL
  Filled 2019-08-09: qty 2

## 2019-08-09 MED ORDER — ALBUTEROL SULFATE (2.5 MG/3ML) 0.083% IN NEBU
INHALATION_SOLUTION | RESPIRATORY_TRACT | Status: AC
  Filled 2019-08-09: qty 12

## 2019-08-09 MED ORDER — SODIUM CHLORIDE 0.9 % IV BOLUS: 1000.0000 mL | Freq: Once | INTRAVENOUS | Status: DC

## 2019-08-09 MED ORDER — ALBUTEROL (5 MG/ML) CONTINUOUS INHALATION SOLN: 15.0000 mg/h | INHALATION_SOLUTION | Freq: Once | RESPIRATORY_TRACT | Status: DC

## 2019-08-09 MED ORDER — LORAZEPAM 2 MG/ML IJ SOLN
0.5000 mg | Freq: Once | INTRAMUSCULAR | Status: AC
  Administered 2019-08-09: 0.5 mg via INTRAVENOUS
  Filled 2019-08-09: qty 1

## 2019-08-09 MED ORDER — SODIUM CHLORIDE 0.9 % IV BOLUS
500.0000 mL | Freq: Once | INTRAVENOUS | Status: AC
  Administered 2019-08-09: 500 mL via INTRAVENOUS

## 2019-08-09 MED ORDER — METHYLPREDNISOLONE SODIUM SUCC 125 MG IJ SOLR
80.0000 mg | Freq: Once | INTRAMUSCULAR | Status: AC
  Administered 2019-08-09: 80 mg via INTRAVENOUS
  Filled 2019-08-09: qty 2

## 2019-08-09 MED ORDER — SODIUM CHLORIDE 0.9 % IV BOLUS
1000.0000 mL | Freq: Once | INTRAVENOUS | Status: AC
  Administered 2019-08-09: 1000 mL via INTRAVENOUS

## 2019-08-09 MED ORDER — FAMOTIDINE 20 MG PO TABS
20.0000 mg | ORAL_TABLET | Freq: Once | ORAL | Status: AC
  Administered 2019-08-09: 20 mg via ORAL
  Filled 2019-08-09: qty 1

## 2019-08-09 NOTE — ED Triage Notes (Signed)
Increase SOB for the past 2 days getting worse today with sore throat and fever.

## 2019-08-09 NOTE — ED Provider Notes (Signed)
[facility] EMERGENCY DEPARTMENT Provider Note   CSN: [id number] Arrival date & time: 08/09/19  2051     History Chief Complaint  Patient presents with  . Shortness of Breath    Candace King is a 19 y.o. female.  Patient with history of asthma, telangiectasia presents with worsening shortness of breath since after church earlier today.  Patient has combination of feeling anxious and panic attack with asthma symptoms and mild cough.  Patient denies being on steroids currently.  Difficulty obtaining details due to patient's presentation.  Patient denies any blood clot history or cardiac history.  No recent surgeries.        Past Medical History:  Diagnosis Date  . Asthma   . Telangiectasia     There are no problems to display for this patient.   Past Surgical History:  Procedure Laterality Date  . TONSILLECTOMY       OB History   No obstetric history on file.     No family history on file.  Social History   Tobacco Use  . Smoking status: Never Smoker  . Smokeless tobacco: Never Used  Substance Use Topics  . Alcohol use: No  . Drug use: Not Currently    Home Medications Prior to Admission medications   Medication Sig Start Date End Date Taking? Authorizing Provider  albuterol (PROVENTIL HFA;VENTOLIN HFA) 108 (90 Base) MCG/ACT inhaler Inhale into the lungs every 6 (six) hours as needed for wheezing or shortness of breath.   Yes [provider]  ALPRAZolam Duanne Moron) 1 MG tablet Take 1 mg by mouth at bedtime as needed for anxiety.   Yes [provider]  Naphazoline HCl (CLEAR EYES OP) Place 1 drop into both eyes daily as needed (For dry eyes).   Yes [provider]  omeprazole (PRILOSEC) 20 MG capsule Take 20 mg by mouth daily. 08/08/17  Yes [provider]  Probiotic Product (PROBIOTIC PO) Take 1 tablet by mouth daily.   Yes [provider]  prochlorperazine (COMPAZINE) 5 MG tablet Take 5 mg by  mouth every 8 (eight) hours as needed for nausea. 04/15/18  Yes [provider]  QVAR REDIHALER 80 MCG/ACT inhaler Inhale 1 puff into the lungs 2 (two) times daily.  01/14/18  Yes [provider]  sertraline (ZOLOFT) 25 MG tablet TAKE 3 TABLETS BY MOUTH  (75MG ) EVERY MORNING Patient taking differently: Take 75 mg by mouth daily.  06/24/18  Yes Delight Hoh, MD  SUMAtriptan (IMITREX) 25 MG tablet Take 25 mg by mouth as needed for migraine. 07/17/17  Yes [provider]  acetaminophen (TYLENOL) 325 MG tablet Take 2 tablets (650 mg total) by mouth every 6 (six) hours as needed for mild pain. Patient not taking: Reported on 01/14/2018 04/21/13   Isaac Bliss, MD  cephALEXin (KEFLEX) 500 MG capsule Take 1 capsule (500 mg total) by mouth 2 (two) times daily. Patient not taking: Reported on 06/03/2018 01/15/18   Malvin Johns, MD  famotidine (PEPCID) 20 MG tablet Take 1 tablet (20 mg total) by mouth 2 (two) times daily. Patient not taking: Reported on 01/14/2018 07/11/15   Jean Rosenthal, NP  ibuprofen (ADVIL,MOTRIN) 600 MG tablet Take 1 tablet (600 mg total) by mouth every 6 (six) hours as needed. Patient not taking: Reported on 01/14/2018 09/19/15   Kristen Cardinal, NP  LORazepam (ATIVAN) 1 MG tablet Take 1 tablet (1 mg total) by mouth every 8 (eight) hours as needed for anxiety. Patient not  taking: Reported on 01/14/2018 07/11/15   Jean Rosenthal, NP  ondansetron (ZOFRAN) 4 MG tablet Take 1 tablet (4 mg total) by mouth every 6 (six) hours as needed for nausea. Patient not taking: Reported on 01/14/2018 09/19/15   Kristen Cardinal, NP    Allergies    Augmentin [amoxicillin-pot clavulanate] and Morphine and related  Review of Systems   Review of Systems  Unable to perform ROS: Acuity of condition    Physical Exam Updated Vital Signs BP 118/90 (BP Location: Left Arm)   Pulse (!) 132   Temp 98.6 F (37 C)   Resp 20   Ht 5\' 4"  (1.626 m)   Wt 56.7 kg   SpO2  96%   BMI 21.46 kg/m   Physical Exam Vitals and nursing note reviewed.  Constitutional:      General: She is in acute distress.     Appearance: She is well-developed.  HENT:     Head: Normocephalic and atraumatic.  Eyes:     General:        Right eye: No discharge.        Left eye: No discharge.     Conjunctiva/sclera: Conjunctivae normal.  Neck:     Trachea: No tracheal deviation.  Cardiovascular:     Rate and Rhythm: Regular rhythm. Tachycardia present.  Pulmonary:     Effort: Tachypnea present.     Breath sounds: Wheezing present.  Abdominal:     General: There is no distension.     Palpations: Abdomen is soft.     Tenderness: There is no abdominal tenderness. There is no guarding.  Musculoskeletal:     Cervical back: Normal range of motion and neck supple.     Right lower leg: No edema.     Left lower leg: No edema.  Skin:    General: Skin is warm.     Capillary Refill: Capillary refill takes less than 2 seconds.     Coloration: Skin is pale.     Findings: No rash.  Neurological:     Mental Status: She is alert and oriented to person, place, and time.  Psychiatric:        Mood and Affect: Mood is anxious.     ED Results / Procedures / Treatments   Labs (all labs ordered are listed, but only abnormal results are displayed) Labs Reviewed  BASIC METABOLIC PANEL - Abnormal; Notable for the following components:      Result Value   Potassium 3.1 (*)    CO2 14 (*)    Glucose, Bld 117 (*)    Anion gap 18 (*)    All other components within normal limits  CBC WITH DIFFERENTIAL/PLATELET - Abnormal; Notable for the following components:   Hemoglobin 15.5 (*)    All other components within normal limits  SARS CORONAVIRUS 2 BY RT PCR (HOSPITAL ORDER, [place] [facility])  GROUP A STREP BY PCR  D-DIMER, QUANTITATIVE (NOT AT [facility])  I-STAT BETA HCG BLOOD, ED ([facility], [facility], [facility] ONLY)    EKG None  Radiology DG Chest Portable 1 View  Result Date:  08/09/2019 CLINICAL DATA:  Shortness of breath for 2 days, worsening today now with sore throat and fever EXAM: PORTABLE CHEST 1 VIEW COMPARISON:  Radiograph 06/03/2018 FINDINGS: No consolidation, features of edema, pneumothorax, or effusion. Pulmonary vascularity is normally distributed. The cardiomediastinal contours are unremarkable. No acute osseous or soft tissue abnormality. Telemetry leads and mask wire project over the chest and base  of the neck. IMPRESSION: No acute cardiopulmonary abnormality. Electronically Signed   By: Lovena Le M.D.   On: 08/09/2019 21:27    Procedures .Critical Care Performed by: Elnora Morrison, MD Authorized by: Elnora Morrison, MD   Critical care provider statement:    Critical care time (minutes):  40   Critical care start time:  08/09/2019 8:55 PM   Critical care was time spent personally by me on the following activities:  Evaluation of patient's response to treatment, examination of patient, ordering and performing treatments and interventions, ordering and review of laboratory studies, ordering and review of radiographic studies, pulse oximetry, re-evaluation of patient's condition and review of old charts   (including critical care time)  Medications Ordered in ED Medications  albuterol (PROVENTIL,VENTOLIN) solution continuous neb (15 mg/hr Nebulization See Procedure Record 08/09/19 2109)  acetaminophen (TYLENOL) tablet 650 mg (has no administration in time range)  sodium chloride 0.9 % bolus 500 mL (has no administration in time range)  famotidine (PEPCID) tablet 20 mg (has no administration in time range)  sodium chloride 0.9 % bolus 1,000 mL (has no administration in time range)  methylPREDNISolone sodium succinate (SOLU-MEDROL) 125 mg/2 mL injection 80 mg (80 mg Intravenous Given 08/09/19 2122)  sodium chloride 0.9 % bolus 1,000 mL ( Intravenous Stopped 08/09/19 2224)  albuterol (PROVENTIL) (2.5 MG/3ML) 0.083% nebulizer solution (  Given 08/09/19 2109)    LORazepam (ATIVAN) injection 0.5 mg (0.5 mg Intravenous Given 08/09/19 2123)    ED Course  I have reviewed the triage vital signs and the nursing notes.  Pertinent labs & imaging results that were available during my care of the patient were reviewed by me and considered in my medical decision making (see chart for details).    MDM Rules/Calculators/A&P                          Patient presents emergency room in respiratory distress with heart rate in the 160s, wheezing diffuse and very anxious clinically.  Likely multifactorial including asthma exacerbation anxiety secondary to exacerbation.  Other differentials include blood clot patient is low risk D-dimer sent due to significant shortness of breath and tachycardia, pneumonia, Covid, other.  Covid and strep test ordered.  Blood work pending.  EKG reviewed heart rate 158, sinus tachycardia, artifact.  Ativan ordered for anxiety and nausea. Continuous albuterol nebulizer, steroids ordered.  Portable chest x-ray pending Chest x-ray reviewed no acute abnormality.  Patient improved significantly with continuous nebulizer heart rate improved 150s to 105.  Work of breathing normalized.  Patient still has mild chest wall discomfort likely musculoskeletal given work of breathing and reproducible with palpation and movement.  Repeat IV fluid bolus due to persistent lightheadedness.  D-dimer negative no indication for CT scan at this time.  Patient very low risk for blood clots or cardiac event.  Patient has close family friend in the room as her mother is out of town.  Potassium 3.1, normal white blood cell count, mild anion gap likely due to dehydration.  Patient care be signed out to reassess and likely close outpatient follow-up with oral steroids/albuterol.  Final Clinical Impression(s) / ED Diagnoses Final diagnoses:  Acute dyspnea  Moderate persistent asthma with exacerbation  Anxiety    Rx / DC Orders ED Discharge Orders    None        Elnora Morrison, MD 08/09/19 2347

## 2019-08-10 ENCOUNTER — Encounter (HOSPITAL_COMMUNITY): Payer: Self-pay | Admitting: Internal Medicine

## 2019-08-10 DIAGNOSIS — F32A Depression, unspecified: Secondary | ICD-10-CM | POA: Diagnosis present

## 2019-08-10 DIAGNOSIS — J4541 Moderate persistent asthma with (acute) exacerbation: Secondary | ICD-10-CM | POA: Diagnosis not present

## 2019-08-10 DIAGNOSIS — R000 Tachycardia, unspecified: Secondary | ICD-10-CM | POA: Diagnosis not present

## 2019-08-10 DIAGNOSIS — J45909 Unspecified asthma, uncomplicated: Secondary | ICD-10-CM | POA: Diagnosis present

## 2019-08-10 LAB — CBC WITH DIFFERENTIAL/PLATELET
Abs Immature Granulocytes: 0 10*3/uL (ref 0.00–0.07)
Basophils Absolute: 0 10*3/uL (ref 0.0–0.1)
Basophils Relative: 0 %
Eosinophils Absolute: 0 10*3/uL (ref 0.0–0.5)
Eosinophils Relative: 0 %
HCT: 37.1 % (ref 36.0–46.0)
Hemoglobin: 12.3 g/dL (ref 12.0–15.0)
Lymphocytes Relative: 8 %
Lymphs Abs: 0.2 10*3/uL — ABNORMAL LOW (ref 0.7–4.0)
MCH: 31.9 pg (ref 26.0–34.0)
MCHC: 33.2 g/dL (ref 30.0–36.0)
MCV: 96.1 fL (ref 80.0–100.0)
Monocytes Absolute: 0 10*3/uL — ABNORMAL LOW (ref 0.1–1.0)
Monocytes Relative: 1 %
Neutro Abs: 2.5 10*3/uL (ref 1.7–7.7)
Neutrophils Relative %: 91 %
Platelets: 171 10*3/uL (ref 150–400)
RBC: 3.86 MIL/uL — ABNORMAL LOW (ref 3.87–5.11)
RDW: 11.9 % (ref 11.5–15.5)
WBC: 2.8 10*3/uL — ABNORMAL LOW (ref 4.0–10.5)
nRBC: 0 % (ref 0.0–0.2)
nRBC: 0 /100 WBC

## 2019-08-10 LAB — BASIC METABOLIC PANEL
Anion gap: 6 (ref 5–15)
BUN: <5 mg/dL — ABNORMAL LOW (ref 6–20)
CO2: 19 mmol/L — ABNORMAL LOW (ref 22–32)
Calcium: 7.9 mg/dL — ABNORMAL LOW (ref 8.9–10.3)
Chloride: 116 mmol/L — ABNORMAL HIGH (ref 98–111)
Creatinine, Ser: 0.54 mg/dL (ref 0.44–1.00)
GFR calc Af Amer: >60 mL/min (ref >60)
GFR calc non Af Amer: >60 mL/min (ref >60)
Glucose, Bld: 151 mg/dL — ABNORMAL HIGH (ref 70–99)
Potassium: 4.1 mmol/L (ref 3.5–5.1)
Sodium: 141 mmol/L (ref 135–145)

## 2019-08-10 LAB — HEPATIC FUNCTION PANEL
ALT: 12 U/L (ref 0–44)
AST: 15 U/L (ref 15–41)
Albumin: 3.1 g/dL — ABNORMAL LOW (ref 3.5–5.0)
Alkaline Phosphatase: 46 U/L (ref 38–126)
Bilirubin, Direct: <0.1 mg/dL (ref 0.0–0.2)
Total Bilirubin: 0.6 mg/dL (ref 0.3–1.2)
Total Protein: 5.6 g/dL — ABNORMAL LOW (ref 6.5–8.1)

## 2019-08-10 LAB — RAPID URINE DRUG SCREEN, HOSP PERFORMED
Amphetamines: NOT DETECTED
Barbiturates: NOT DETECTED
Benzodiazepines: NOT DETECTED
Cocaine: NOT DETECTED
Opiates: NOT DETECTED
Tetrahydrocannabinol: NOT DETECTED

## 2019-08-10 LAB — I-STAT VENOUS BLOOD GAS, ED
Acid-base deficit: 5 mmol/L — ABNORMAL HIGH (ref 0.0–2.0)
Bicarbonate: 16 mmol/L — ABNORMAL LOW (ref 20.0–28.0)
Calcium, Ion: 1.04 mmol/L — ABNORMAL LOW (ref 1.15–1.40)
HCT: 36 % (ref 36.0–46.0)
Hemoglobin: 12.2 g/dL (ref 12.0–15.0)
O2 Saturation: 99 %
Potassium: 2.9 mmol/L — ABNORMAL LOW (ref 3.5–5.1)
Sodium: 144 mmol/L (ref 135–145)
TCO2: 17 mmol/L — ABNORMAL LOW (ref 22–32)
pCO2, Ven: 20 mmHg — ABNORMAL LOW (ref 44.0–60.0)
pH, Ven: 7.512 — ABNORMAL HIGH (ref 7.250–7.430)
pO2, Ven: 128 mmHg — ABNORMAL HIGH (ref 32.0–45.0)

## 2019-08-10 LAB — CK: Total CK: 54 U/L (ref 38–234)

## 2019-08-10 LAB — LACTIC ACID, PLASMA: Lactic Acid, Venous: 1.1 mmol/L (ref 0.5–1.9)

## 2019-08-10 LAB — MAGNESIUM: Magnesium: 1.7 mg/dL (ref 1.7–2.4)

## 2019-08-10 LAB — HIV ANTIBODY (ROUTINE TESTING W REFLEX): HIV Screen 4th Generation wRfx: NONREACTIVE

## 2019-08-10 LAB — ACETAMINOPHEN LEVEL: Acetaminophen (Tylenol), Serum: <10 ug/mL — ABNORMAL LOW (ref 10–30)

## 2019-08-10 LAB — ETHANOL: Alcohol, Ethyl (B): <10 mg/dL (ref <10)

## 2019-08-10 LAB — SALICYLATE LEVEL: Salicylate Lvl: <7 mg/dL — ABNORMAL LOW (ref 7.0–30.0)

## 2019-08-10 LAB — TSH: TSH: 1.1 u[IU]/mL (ref 0.350–4.500)

## 2019-08-10 MED ORDER — ONDANSETRON HCL 4 MG/2ML IJ SOLN
4.0000 mg | Freq: Four times a day (QID) | INTRAMUSCULAR | Status: DC | PRN
  Administered 2019-08-10 – 2019-08-15 (×6): 4 mg via INTRAVENOUS
  Filled 2019-08-10 (×6): qty 2

## 2019-08-10 MED ORDER — ENOXAPARIN SODIUM 40 MG/0.4ML ~~LOC~~ SOLN
40.0000 mg | Freq: Every day | SUBCUTANEOUS | Status: DC
  Administered 2019-08-10 – 2019-08-15 (×6): 40 mg via SUBCUTANEOUS
  Filled 2019-08-10 (×7): qty 0.4

## 2019-08-10 MED ORDER — PROCHLORPERAZINE EDISYLATE 10 MG/2ML IJ SOLN
10.0000 mg | Freq: Once | INTRAMUSCULAR | Status: AC
  Administered 2019-08-10: 10 mg via INTRAVENOUS
  Filled 2019-08-10: qty 2

## 2019-08-10 MED ORDER — SODIUM CHLORIDE 0.9 % IV SOLN
1000.0000 mL | INTRAVENOUS | Status: DC
  Administered 2019-08-10: 1000 mL via INTRAVENOUS

## 2019-08-10 MED ORDER — POTASSIUM CHLORIDE 10 MEQ/100ML IV SOLN
10.0000 meq | INTRAVENOUS | Status: AC
  Administered 2019-08-10 (×2): 10 meq via INTRAVENOUS
  Filled 2019-08-10: qty 100

## 2019-08-10 MED ORDER — ONDANSETRON HCL 4 MG PO TABS
4.0000 mg | ORAL_TABLET | Freq: Four times a day (QID) | ORAL | Status: DC | PRN
  Administered 2019-08-16: 4 mg via ORAL
  Filled 2019-08-10: qty 1

## 2019-08-10 MED ORDER — SODIUM CHLORIDE 0.9 % IV BOLUS (SEPSIS)
1000.0000 mL | Freq: Once | INTRAVENOUS | Status: AC
  Administered 2019-08-10: 1000 mL via INTRAVENOUS

## 2019-08-10 MED ORDER — ALPRAZOLAM 0.5 MG PO TABS
1.0000 mg | ORAL_TABLET | Freq: Every evening | ORAL | Status: DC | PRN
  Administered 2019-08-10 – 2019-08-15 (×6): 1 mg via ORAL
  Filled 2019-08-10 (×6): qty 2

## 2019-08-10 MED ORDER — SODIUM CHLORIDE 0.9 % IV SOLN: INTRAVENOUS | Status: AC

## 2019-08-10 MED ORDER — BUDESONIDE 0.25 MG/2ML IN SUSP
0.2500 mg | Freq: Two times a day (BID) | RESPIRATORY_TRACT | Status: DC
  Administered 2019-08-10 – 2019-08-16 (×12): 0.25 mg via RESPIRATORY_TRACT
  Filled 2019-08-10 (×14): qty 2

## 2019-08-10 MED ORDER — LEVALBUTEROL HCL 0.63 MG/3ML IN NEBU: 0.6300 mg | INHALATION_SOLUTION | Freq: Four times a day (QID) | RESPIRATORY_TRACT | Status: DC | PRN

## 2019-08-10 NOTE — ED Notes (Signed)
Report given to 5c 

## 2019-08-10 NOTE — ED Notes (Signed)
Attempt made to ambulate pt and pt states she is not able to ambulate she still very dizzy and weak, pt getting very anxious when attempted to walk HR up to the 150. Dr. Leonette Monarch notified.

## 2019-08-10 NOTE — Progress Notes (Addendum)
[facility] PROGRESS NOTE    Progress Note  Candace King  EAV:[id number] DOB: 2000/04/18 DOA: 08/09/2019 PCP: Judithann Sauger, MD     Brief Narrative:   Candace King is an 19 y.o. female past medical history of asthma and depression comes to the ED after becoming acutely nauseated and dizzy and short of breath after visiting church, when she got home she took a couple of Advil and Compazine for nausea the symptoms persisted so she came into the ED where she was found in sinus tachycardia with a QTC of 456 QRS of 101 - D-dimer, potassium of 3.1 and anion gap of 18 with a bicarb of 14 taking any new medications.  Assessment/Plan:   Dizziness likely due to sinus Tachycardia Of unclear etiology normal reflex. Check orthostatics., The admitting physician discussed with poison control and not Compatible with serotonin syndrome. Hold sertraline, Imitrex lorazepam and Compazine also hold Xanax. TSH is pending, pregnancy test is negative  High anion gap metabolic acidosis: Has an anion gap of 18, check a lactic acid, also check an alcohol level, she denies any ingestion of any  alcohol, she does deny any suicidal thoughts. Her skin is intact.  HypoKalemia: Replete orally check a magnesium try to keep potassium greater than 4 magnesium greater than 2   DVT prophylaxis: lovenox Family Communication: mom [phone], sister [phone] Status is: Observation  The patient remains OBS appropriate and will d/c before 2 midnights.  Dispo: The patient is from: Home              Anticipated d/c is to: Home              Anticipated d/c date is: 1 day              Patient currently is not medically stable to d/c.    Code Status:     Code Status Orders  (From admission, onward)         Start     Ordered   08/10/19 0501  Full code  Continuous        08/10/19 0503        Code Status History    This patient has a current code status but no historical code status.    Advance Care Planning Activity        IV Access:    Peripheral IV   Procedures and diagnostic studies:   DG Chest Portable 1 View  Result Date: 08/09/2019 CLINICAL DATA:  Shortness of breath for 2 days, worsening today now with sore throat and fever EXAM: PORTABLE CHEST 1 VIEW COMPARISON:  Radiograph 06/03/2018 FINDINGS: No consolidation, features of edema, pneumothorax, or effusion. Pulmonary vascularity is normally distributed. The cardiomediastinal contours are unremarkable. No acute osseous or soft tissue abnormality. Telemetry leads and mask wire project over the chest and base of the neck. IMPRESSION: No acute cardiopulmonary abnormality. Electronically Signed   By: Lovena Le M.D.   On: 08/09/2019 21:27     Medical Consultants:    None.  Anti-Infectives:   None  Subjective:    Candace King feels much better today.  Objective:    Vitals:   08/10/19 0531 08/10/19 0545 08/10/19 0616 08/10/19 0643  BP: 91/60 98/74 (!) 95/59 119/83  Pulse: (!) 55 71 (!) 59 (!) 56  Resp: 16 20 16 17   Temp:      SpO2: 96% 97% 98% 98%  Weight:      Height:  SpO2: 98 %   Intake/Output Summary (Last 24 hours) at 08/10/2019 0703 Last data filed at 08/10/2019 0658 Gross per 24 hour  Intake 5389.09 ml  Output 2400 ml  Net 2989.09 ml   Filed Weights   08/09/19 2106  Weight: 56.7 kg    Exam: General exam: In no acute distress. Respiratory system: Good air movement and clear to auscultation. Cardiovascular system: S1 & S2 heard, RRR. No JVD. Gastrointestinal system: Abdomen is nondistended, soft and nontender.  Extremities: No pedal edema. Skin: No rashes, lesions or ulcers Psychiatry: Judgement and insight appear normal. Mood & affect appropriate.    Data Reviewed:    Labs: Basic Metabolic Panel: Recent Labs  Lab 08/09/19 2108 08/10/19 0136  NA 140 144  K 3.1* 2.9*  CL 108  --   CO2 14*  --   GLUCOSE 117*  --   BUN 6  --   CREATININE 0.91  --     CALCIUM 9.7  --    GFR Estimated Creatinine Clearance: 85.9 mL/min (by C-G formula based on SCr of 0.91 mg/dL). Liver Function Tests: No results for input(s): AST, ALT, ALKPHOS, BILITOT, PROT, ALBUMIN in the last 168 hours. No results for input(s): LIPASE, AMYLASE in the last 168 hours. No results for input(s): AMMONIA in the last 168 hours. Coagulation profile No results for input(s): INR, PROTIME in the last 168 hours. COVID-19 Labs  Recent Labs    08/09/19 2108  DDIMER 0.40    Lab Results  Component Value Date   SARSCOV2NAA NEGATIVE 08/09/2019    CBC: Recent Labs  Lab 08/09/19 2108 08/10/19 0136  WBC 6.6  --   NEUTROABS 3.6  --   HGB 15.5* 12.2  HCT 44.7 36.0  MCV 92.5  --   PLT 259  --    Cardiac Enzymes: No results for input(s): CKTOTAL, CKMB, CKMBINDEX, TROPONINI in the last 168 hours. BNP (last 3 results) No results for input(s): PROBNP in the last 8760 hours. CBG: No results for input(s): GLUCAP in the last 168 hours. D-Dimer: Recent Labs    08/09/19 2108  DDIMER 0.40   Hgb A1c: No results for input(s): HGBA1C in the last 72 hours. Lipid Profile: No results for input(s): CHOL, HDL, LDLCALC, TRIG, CHOLHDL, LDLDIRECT in the last 72 hours. Thyroid function studies: No results for input(s): TSH, T4TOTAL, T3FREE, THYROIDAB in the last 72 hours.  Invalid input(s): FREET3 Anemia work up: No results for input(s): VITAMINB12, FOLATE, FERRITIN, TIBC, IRON, RETICCTPCT in the last 72 hours. Sepsis Labs: Recent Labs  Lab 08/09/19 2108  WBC 6.6   Microbiology Recent Results (from the past 240 hour(s))  SARS Coronavirus 2 by RT PCR (hospital order, performed in [facility] hospital lab) Nasopharyngeal Throat     Status: None   Collection Time: 08/09/19  9:17 PM   Specimen: Throat; Nasopharyngeal  Result Value Ref Range Status   SARS Coronavirus 2 NEGATIVE NEGATIVE Final    Comment: (NOTE) SARS-CoV-2 target nucleic acids are NOT DETECTED.  The  SARS-CoV-2 RNA is generally detectable in upper and lower respiratory specimens during the acute phase of infection. The lowest concentration of SARS-CoV-2 viral copies this assay can detect is 250 copies / mL. A negative result does not preclude SARS-CoV-2 infection and should not be used as the sole basis for treatment or other patient management decisions.  A negative result may occur with improper specimen collection / handling, submission of specimen other than nasopharyngeal swab, presence of viral mutation(s) within the  areas targeted by this assay, and inadequate number of viral copies (<250 copies / mL). A negative result must be combined with clinical observations, patient history, and epidemiological information.  Fact Sheet for Patients:   StrictlyIdeas.no  Fact Sheet for Healthcare Providers: BankingDealers.co.za  This test is not yet approved or  cleared by the [place] FDA and has been authorized for detection and/or diagnosis of SARS-CoV-2 by FDA under an Emergency Use Authorization (EUA).  This EUA will remain in effect (meaning this test can be used) for the duration of the COVID-19 declaration under Section 564(b)(1) of the Act, 21 U.S.C. section 360bbb-3(b)(1), unless the authorization is terminated or revoked sooner.  Performed at [facility], [place] [address] [place], [place] [address]   Group A Strep by PCR     Status: None   Collection Time: 08/09/19  9:17 PM   Specimen: Throat; Sterile Swab  Result Value Ref Range Status   Group A Strep by PCR NOT DETECTED NOT DETECTED Final    Comment: Performed at [facility], [place] [address], [place], [place] [address]     Medications:   . budesonide  0.25 mg Inhalation BID  . enoxaparin (LOVENOX) injection  40 mg Subcutaneous Daily   Continuous Infusions: . sodium chloride        LOS: 0 days   Charlynne Cousins  [facility]  08/10/2019, 7:03 AM

## 2019-08-10 NOTE — Progress Notes (Signed)
   08/10/19 1648 08/10/19 1650 08/10/19 1653  Vitals  BP Location Left Arm Left Arm Left Arm  BP Method Automatic Automatic  --   Patient Position (if appropriate) Sitting Standing Standing  Pulse Rate 76 (!) 115 (!) 108  Pulse Rate Source Dinamap Dinamap Dinamap  Resp  --   --   --   Orthostatic Sitting  BP- Sitting 103/76  --   --   Pulse- Sitting 76  --   --   Orthostatic Standing at 0 minutes  BP- Standing at 0 minutes  --  (!) 109/92  --   Pulse- Standing at 0 minutes  --  115  --   Orthostatic Standing at 3 minutes  BP- Standing at 3 minutes  --   --  112/75  Pulse- Standing at 3 minutes  --   --  108    08/10/19 1819  Vitals  BP Location Left Arm  BP Method Automatic  Patient Position (if appropriate) Sitting  Pulse Rate 98  Pulse Rate Source Dinamap  Resp 18  Orthostatic Sitting  BP- Sitting  --   Pulse- Sitting  --   Orthostatic Standing at 0 minutes  BP- Standing at 0 minutes  --   Pulse- Standing at 0 minutes  --   Orthostatic Standing at 3 minutes  BP- Standing at 3 minutes  --   Pulse- Standing at 3 minutes  --    ORTHOSTATIC VSS as above.

## 2019-08-10 NOTE — ED Notes (Signed)
Pt able to drink and eat crackers with no signs of nausea or vomiting.

## 2019-08-10 NOTE — ED Provider Notes (Signed)
I assumed care of this patient.  Please see previous provider note for further details of Hx, PE.  Briefly patient is a 19 y.o. female who presented for shortness of breath concerning for asthma exacerbation.  Patient was treated with breathing treatments.  Work-up was reassuring with a negative dimer, chest x-ray and labs.  Patient was noted to be significantly tachycardic and plan was to continue to hydrate and reassess the patient..    Labs also notable for decreased bicarb.  VBG notable for respiratory alkalosis likely from hyperventilation.  Patient was provided with additional liter of IV fluid bolus.  On reassessment, patient's had persistent tachycardia.  Admitted to medicine for continued management.  .Critical Care Performed by: Fatima Blank, MD Authorized by: Fatima Blank, MD     CRITICAL CARE Performed by: Grayce Sessions Karem Farha Total critical care time: 50 minutes Critical care time was exclusive of separately billable procedures and treating other patients. Critical care was necessary to treat or prevent imminent or life-threatening deterioration. Critical care was time spent personally by me on the following activities: development of treatment plan with patient and/or surrogate as well as nursing, discussions with consultants, evaluation of patient's response to treatment, examination of patient, obtaining history from patient or surrogate, ordering and performing treatments and interventions, ordering and review of laboratory studies, ordering and review of radiographic studies, pulse oximetry and re-evaluation of patient's condition.     Fatima Blank, MD 08/10/19 [phone]

## 2019-08-10 NOTE — H&P (Signed)
History and Physical    Candace King BLT:[id number] DOB: May 15, 2000 DOA: 08/09/2019  PCP: Judithann Sauger, MD  Patient coming from: Home.  Chief Complaint: Nausea dizziness shortness of breath.  HPI: Candace King is a 19 y.o. female with history of asthma and depression presents to the ER after patient became acutely nauseated with dizziness and short of breath after visiting church yesterday around 11 AM.  Patient states that she had taken the bread which usually is dipped in grape juice during the holy communion but looks like yesterday it was dipped in the wine and she had taken it for the first time.  5 minutes later patient became nauseated started feeling dizzy not feeling well and became short of breath.  At home she also had a temperature for which she took Advil.  Patient also took 1 dose of prochlorperazine for nausea at home.  Since patient symptoms persisted patient came to the ER.  ED Course: In the ER patient easily becomes tachycardic on movement.  EKG shows sinus tachycardia with QTC of 456 ms and QRS of 101 ms.  Labs show negative D-dimer with potassium of 3.1.  Anion gap is 18 bicarb is 14.  Patient states her oral medications including sertraline, as needed Xanax Qvar and albuterol are all has been taking for many years.  Denies taking any over-the-counter medications.  Patient was given IV Solu-Medrol for possible asthma exacerbation with nebulizer and also prochlorperazine for nausea.  Patient was started on fluids potassium replacement admitted for further observation given the persistent tachycardia.  Review of Systems: As per HPI, rest all negative.   Past Medical History:  Diagnosis Date  . Asthma   . Telangiectasia     Past Surgical History:  Procedure Laterality Date  . TONSILLECTOMY       reports that she has never smoked. She has never used smokeless tobacco. She reports previous drug use. She reports that she does not drink alcohol.  Allergies    Allergen Reactions  . Augmentin [Amoxicillin-Pot Clavulanate] Diarrhea  . Morphine And Related     Metal taste    Family History  Problem Relation Age of Onset  . Anxiety disorder Mother   . Breast cancer Paternal Grandmother     Prior to Admission medications   Medication Sig Start Date End Date Taking? Authorizing Provider  albuterol (PROVENTIL HFA;VENTOLIN HFA) 108 (90 Base) MCG/ACT inhaler Inhale into the lungs every 6 (six) hours as needed for wheezing or shortness of breath.   Yes [provider]  ALPRAZolam Duanne Moron) 1 MG tablet Take 1 mg by mouth at bedtime as needed for anxiety.   Yes [provider]  Naphazoline HCl (CLEAR EYES OP) Place 1 drop into both eyes daily as needed (For dry eyes).   Yes [provider]  omeprazole (PRILOSEC) 20 MG capsule Take 20 mg by mouth daily. 08/08/17  Yes [provider]  Probiotic Product (PROBIOTIC PO) Take 1 tablet by mouth daily.   Yes [provider]  prochlorperazine (COMPAZINE) 5 MG tablet Take 5 mg by mouth every 8 (eight) hours as needed for nausea. 04/15/18  Yes [provider]  QVAR REDIHALER 80 MCG/ACT inhaler Inhale 1 puff into the lungs 2 (two) times daily.  01/14/18  Yes [provider]  sertraline (ZOLOFT) 25 MG tablet TAKE 3 TABLETS BY MOUTH  (75MG ) EVERY MORNING Patient taking differently: Take 75 mg by mouth daily.  06/24/18  Yes Delight Hoh, MD  SUMAtriptan (  IMITREX) 25 MG tablet Take 25 mg by mouth as needed for migraine. 07/17/17  Yes [provider]  acetaminophen (TYLENOL) 325 MG tablet Take 2 tablets (650 mg total) by mouth every 6 (six) hours as needed for mild pain. Patient not taking: Reported on 01/14/2018 04/21/13   Isaac Bliss, MD  cephALEXin (KEFLEX) 500 MG capsule Take 1 capsule (500 mg total) by mouth 2 (two) times daily. Patient not taking: Reported on 06/03/2018 01/15/18   Malvin Johns, MD  famotidine (PEPCID) 20 MG tablet Take 1  tablet (20 mg total) by mouth 2 (two) times daily. Patient not taking: Reported on 01/14/2018 07/11/15   Jean Rosenthal, NP  ibuprofen (ADVIL,MOTRIN) 600 MG tablet Take 1 tablet (600 mg total) by mouth every 6 (six) hours as needed. Patient not taking: Reported on 01/14/2018 09/19/15   Kristen Cardinal, NP  LORazepam (ATIVAN) 1 MG tablet Take 1 tablet (1 mg total) by mouth every 8 (eight) hours as needed for anxiety. Patient not taking: Reported on 01/14/2018 07/11/15   Jean Rosenthal, NP  ondansetron (ZOFRAN) 4 MG tablet Take 1 tablet (4 mg total) by mouth every 6 (six) hours as needed for nausea. Patient not taking: Reported on 01/14/2018 09/19/15   Kristen Cardinal, NP    Physical Exam: Constitutional: Moderately built and nourished. Vitals:   08/10/19 0228 08/10/19 0238 08/10/19 0243 08/10/19 0400  BP: 105/61   104/73  Pulse: 89 (!) 138 (!) 112 89  Resp: 16 20 (!) 21 19  Temp:      SpO2: 97% 100% 99% 98%  Weight:      Height:       Eyes: Anicteric no pallor. ENMT: No discharge from the ears eyes nose or mouth. Neck: No mass felt.  No neck rigidity. Respiratory: No rhonchi or crepitations. Cardiovascular: S1-S2 heard. Abdomen: Soft nontender bowel sounds present. Musculoskeletal: No edema. Skin: No rash. Neurologic: Alert awake oriented time place and person.  Moves all extremities. Psychiatric: Appears normal but normal affect.   Labs on Admission: I have personally reviewed following labs and imaging studies  CBC: Recent Labs  Lab 08/09/19 2108 08/10/19 0136  WBC 6.6  --   NEUTROABS 3.6  --   HGB 15.5* 12.2  HCT 44.7 36.0  MCV 92.5  --   PLT 259  --    Basic Metabolic Panel: Recent Labs  Lab 08/09/19 2108 08/10/19 0136  NA 140 144  K 3.1* 2.9*  CL 108  --   CO2 14*  --   GLUCOSE 117*  --   BUN 6  --   CREATININE 0.91  --   CALCIUM 9.7  --    GFR: Estimated Creatinine Clearance: 85.9 mL/min (by C-G formula based on SCr of 0.91 mg/dL). Liver Function  Tests: No results for input(s): AST, ALT, ALKPHOS, BILITOT, PROT, ALBUMIN in the last 168 hours. No results for input(s): LIPASE, AMYLASE in the last 168 hours. No results for input(s): AMMONIA in the last 168 hours. Coagulation Profile: No results for input(s): INR, PROTIME in the last 168 hours. Cardiac Enzymes: No results for input(s): CKTOTAL, CKMB, CKMBINDEX, TROPONINI in the last 168 hours. BNP (last 3 results) No results for input(s): PROBNP in the last 8760 hours. HbA1C: No results for input(s): HGBA1C in the last 72 hours. CBG: No results for input(s): GLUCAP in the last 168 hours. Lipid Profile: No results for input(s): CHOL, HDL, LDLCALC, TRIG, CHOLHDL, LDLDIRECT in the last 72 hours. Thyroid Function Tests: No  results for input(s): TSH, T4TOTAL, FREET4, T3FREE, THYROIDAB in the last 72 hours. Anemia Panel: No results for input(s): VITAMINB12, FOLATE, FERRITIN, TIBC, IRON, RETICCTPCT in the last 72 hours. Urine analysis:    Component Value Date/Time   COLORURINE COLORLESS (A) 06/03/2018 0746   APPEARANCEUR CLEAR 06/03/2018 0746   LABSPEC 1.004 (L) 06/03/2018 0746   PHURINE 7.0 06/03/2018 0746   GLUCOSEU NEGATIVE 06/03/2018 0746   HGBUR NEGATIVE 06/03/2018 0746   BILIRUBINUR NEGATIVE 06/03/2018 0746   KETONESUR 5 (A) 06/03/2018 0746   PROTEINUR NEGATIVE 06/03/2018 0746   UROBILINOGEN 0.2 04/21/2013 2054   NITRITE NEGATIVE 06/03/2018 0746   LEUKOCYTESUR SMALL (A) 06/03/2018 0746   Sepsis Labs: @LABRCNTIP (procalcitonin:4,lacticidven:4) ) Recent Results (from the past 240 hour(s))  SARS Coronavirus 2 by RT PCR (hospital order, performed in [place] hospital lab) Nasopharyngeal Throat     Status: None   Collection Time: 08/09/19  9:17 PM   Specimen: Throat; Nasopharyngeal  Result Value Ref Range Status   SARS Coronavirus 2 NEGATIVE NEGATIVE Final    Comment: (NOTE) SARS-CoV-2 target nucleic acids are NOT DETECTED.  The SARS-CoV-2 RNA is generally detectable in  upper and lower respiratory specimens during the acute phase of infection. The lowest concentration of SARS-CoV-2 viral copies this assay can detect is 250 copies / mL. A negative result does not preclude SARS-CoV-2 infection and should not be used as the sole basis for treatment or other patient management decisions.  A negative result may occur with improper specimen collection / handling, submission of specimen other than nasopharyngeal swab, presence of viral mutation(s) within the areas targeted by this assay, and inadequate number of viral copies (<250 copies / mL). A negative result must be combined with clinical observations, patient history, and epidemiological information.  Fact Sheet for Patients:   StrictlyIdeas.no  Fact Sheet for Healthcare Providers: BankingDealers.co.za  This test is not yet approved or  cleared by the [place] FDA and has been authorized for detection and/or diagnosis of SARS-CoV-2 by FDA under an Emergency Use Authorization (EUA).  This EUA will remain in effect (meaning this test can be used) for the duration of the COVID-19 declaration under Section 564(b)(1) of the Act, 21 U.S.C. section 360bbb-3(b)(1), unless the authorization is terminated or revoked sooner.  Performed at [facility], [place] [address], [place], [place] [address]   Group A Strep by PCR     Status: None   Collection Time: 08/09/19  9:17 PM   Specimen: Throat; Sterile Swab  Result Value Ref Range Status   Group A Strep by PCR NOT DETECTED NOT DETECTED Final    Comment: Performed at [facility], [place] [address], [place], [place] [address]     Radiological Exams on Admission: DG Chest Portable 1 View  Result Date: 08/09/2019 CLINICAL DATA:  Shortness of breath for 2 days, worsening today now with sore throat and fever EXAM: PORTABLE CHEST 1 VIEW COMPARISON:  Radiograph 06/03/2018 FINDINGS: No consolidation, features  of edema, pneumothorax, or effusion. Pulmonary vascularity is normally distributed. The cardiomediastinal contours are unremarkable. No acute osseous or soft tissue abnormality. Telemetry leads and mask wire project over the chest and base of the neck. IMPRESSION: No acute cardiopulmonary abnormality. Electronically Signed   By: Lovena Le M.D.   On: 08/09/2019 21:27    EKG: Independently reviewed.  Sinus tachycardia.  Assessment/Plan Principal Problem:   Tachycardia Active Problems:   Asthma   Depression    1. Tachycardia with complaint of  nausea dizziness -cause of which is not clear.  On exam patient does not have any hyperreflexia or clonus or any signs of muscle rigidity.  Will check orthostatics after fluids.  Patient feels generally weak.  I did discuss with poison control.  At this time symptoms are not compatible with any serotonin syndrome.  However poison control advised to hold off the sertraline for now.  We will repeat labs including TSH metabolic panel CBC urine drug screen and magnesium recheck orthostatics.  Continue hydration. 2. History of asthma at the time of my exam patient is not wheezing.  We will keep patient on Xopenex because of the tachycardia will avoid albuterol. 3. History of depression and anxiety on sertraline which is on hold see #1.  As needed Xanax will be continued. 4. History of migraine patient takes as needed prochlorperazine at home.   DVT prophylaxis: Lovenox. Code Status: Full code. Family Communication: Patient's family friend at the bedside. Disposition Plan: Home. Consults called: Discussed with poison control. Admission status: Observation.   Rise Patience MD [facility] Pager [phone].  If 7PM-7AM, please contact night-coverage [email or web address] Password [email or web address]  08/10/2019, 5:04 AM

## 2019-08-11 ENCOUNTER — Observation Stay (HOSPITAL_COMMUNITY): Payer: 59

## 2019-08-11 DIAGNOSIS — F419 Anxiety disorder, unspecified: Secondary | ICD-10-CM

## 2019-08-11 DIAGNOSIS — J4541 Moderate persistent asthma with (acute) exacerbation: Secondary | ICD-10-CM

## 2019-08-11 LAB — CBC WITH DIFFERENTIAL/PLATELET
Abs Immature Granulocytes: 0.01 10*3/uL (ref 0.00–0.07)
Basophils Absolute: 0 10*3/uL (ref 0.0–0.1)
Basophils Relative: 1 %
Eosinophils Absolute: 0 10*3/uL (ref 0.0–0.5)
Eosinophils Relative: 0 %
HCT: 40.2 % (ref 36.0–46.0)
Hemoglobin: 14.2 g/dL (ref 12.0–15.0)
Immature Granulocytes: 0 %
Lymphocytes Relative: 28 %
Lymphs Abs: 1.9 10*3/uL (ref 0.7–4.0)
MCH: 32.9 pg (ref 26.0–34.0)
MCHC: 35.3 g/dL (ref 30.0–36.0)
MCV: 93.1 fL (ref 80.0–100.0)
Monocytes Absolute: 0.6 10*3/uL (ref 0.1–1.0)
Monocytes Relative: 9 %
Neutro Abs: 4 10*3/uL (ref 1.7–7.7)
Neutrophils Relative %: 62 %
Platelets: 219 10*3/uL (ref 150–400)
RBC: 4.32 MIL/uL (ref 3.87–5.11)
RDW: 11.8 % (ref 11.5–15.5)
WBC: 6.6 10*3/uL (ref 4.0–10.5)
nRBC: 0 % (ref 0.0–0.2)

## 2019-08-11 LAB — URINALYSIS, ROUTINE W REFLEX MICROSCOPIC
Bacteria, UA: NONE SEEN
Bilirubin Urine: NEGATIVE
Glucose, UA: NEGATIVE mg/dL
Ketones, ur: NEGATIVE mg/dL
Leukocytes,Ua: NEGATIVE
Nitrite: NEGATIVE
Protein, ur: NEGATIVE mg/dL
Specific Gravity, Urine: 1.005 (ref 1.005–1.030)
pH: 7 (ref 5.0–8.0)

## 2019-08-11 LAB — BASIC METABOLIC PANEL
Anion gap: 7 (ref 5–15)
BUN: <5 mg/dL — ABNORMAL LOW (ref 6–20)
CO2: 24 mmol/L (ref 22–32)
Calcium: 8.3 mg/dL — ABNORMAL LOW (ref 8.9–10.3)
Chloride: 109 mmol/L (ref 98–111)
Creatinine, Ser: 0.77 mg/dL (ref 0.44–1.00)
GFR calc Af Amer: >60 mL/min (ref >60)
GFR calc non Af Amer: >60 mL/min (ref >60)
Glucose, Bld: 97 mg/dL (ref 70–99)
Potassium: 3.8 mmol/L (ref 3.5–5.1)
Sodium: 140 mmol/L (ref 135–145)

## 2019-08-11 MED ORDER — LORAZEPAM 1 MG PO TABS
1.0000 mg | ORAL_TABLET | Freq: Once | ORAL | Status: AC
  Administered 2019-08-11: 1 mg via ORAL
  Filled 2019-08-11: qty 1

## 2019-08-11 MED ORDER — NAPROXEN 250 MG PO TABS
500.0000 mg | ORAL_TABLET | Freq: Two times a day (BID) | ORAL | Status: DC
  Administered 2019-08-11: 500 mg via ORAL
  Filled 2019-08-11 (×2): qty 2

## 2019-08-11 MED ORDER — IOHEXOL 9 MG/ML PO SOLN
500.0000 mL | ORAL | Status: AC
  Administered 2019-08-11 (×2): 500 mL via ORAL

## 2019-08-11 MED ORDER — MORPHINE SULFATE (PF) 4 MG/ML IV SOLN
4.0000 mg | INTRAVENOUS | Status: DC | PRN
  Filled 2019-08-11: qty 1

## 2019-08-11 MED ORDER — KETOROLAC TROMETHAMINE 15 MG/ML IJ SOLN
15.0000 mg | Freq: Four times a day (QID) | INTRAMUSCULAR | Status: DC | PRN
  Administered 2019-08-11 – 2019-08-14 (×9): 15 mg via INTRAVENOUS
  Filled 2019-08-11 (×9): qty 1

## 2019-08-11 MED ORDER — PANTOPRAZOLE SODIUM 40 MG PO TBEC
40.0000 mg | DELAYED_RELEASE_TABLET | Freq: Two times a day (BID) | ORAL | Status: DC
  Administered 2019-08-11 – 2019-08-16 (×11): 40 mg via ORAL
  Filled 2019-08-11 (×11): qty 1

## 2019-08-11 MED ORDER — PANTOPRAZOLE SODIUM 40 MG PO TBEC: 40.0000 mg | DELAYED_RELEASE_TABLET | Freq: Two times a day (BID) | ORAL | 0 refills | Status: DC

## 2019-08-11 MED ORDER — PROCHLORPERAZINE MALEATE 5 MG PO TABS
5.0000 mg | ORAL_TABLET | Freq: Three times a day (TID) | ORAL | Status: DC | PRN
  Administered 2019-08-11 – 2019-08-12 (×2): 5 mg via ORAL
  Filled 2019-08-11 (×3): qty 1

## 2019-08-11 MED ORDER — IOHEXOL 300 MG/ML  SOLN
100.0000 mL | Freq: Once | INTRAMUSCULAR | Status: AC | PRN
  Administered 2019-08-11: 100 mL via INTRAVENOUS

## 2019-08-11 MED ORDER — ACETAMINOPHEN 325 MG PO TABS
650.0000 mg | ORAL_TABLET | Freq: Four times a day (QID) | ORAL | Status: DC | PRN
  Administered 2019-08-11 – 2019-08-15 (×9): 650 mg via ORAL
  Filled 2019-08-11 (×9): qty 2

## 2019-08-11 MED ORDER — SERTRALINE HCL 50 MG PO TABS
75.0000 mg | ORAL_TABLET | Freq: Every day | ORAL | Status: DC
  Administered 2019-08-11 – 2019-08-16 (×6): 75 mg via ORAL
  Filled 2019-08-11 (×6): qty 2

## 2019-08-11 NOTE — Progress Notes (Signed)
Pt transported to CT ?

## 2019-08-11 NOTE — Progress Notes (Signed)
Patient called out complaining of severe abdominal pain/cramping. Heart rate and respirations were up and she seemed to be having a lot of anxiety and almost a panic attack. I spoke with Dr. Olevia Bowens and he wants me to give her 1 dose of Ativan to see if this helps. Will continue to monitor.

## 2019-08-11 NOTE — Progress Notes (Signed)
BP 104/77 (BP Location: Left Arm)   Pulse 85   Temp (!) 97.4 F (36.3 C) (Oral)   Resp 18   Ht 5\' 4"  (7.943 m)   Wt 56.7 kg   SpO2 100%   BMI 21.46 kg/m  With new abdominal pain in the right lower quadrant, she is mildly leukopenic this morning she did tolerate her diet in the morning. Vital signs remained stable, her lactic acid is less than 1. We will place her n.p.o. get a CBC with differential, get a CT scan of the abdomen pelvis with contrast rule out appendicitis.Marland Kitchen UA showed no signs of infection.

## 2019-08-11 NOTE — Discharge Summary (Signed)
Physician Discharge Summary  TAREKA JHAVERI HQP:[id number] DOB: 17-Feb-2000 DOA: 08/09/2019  PCP: Judithann Sauger, MD  Admit date: 08/09/2019 Discharge date: 08/11/2019  Admitted From: Home Disposition:  Home  Recommendations for Outpatient Follow-up:  1. Follow up with PCP in 1-2 weeks   Home Health:No Equipment/Devices:None  Discharge Condition:Stable CODE STATUS:Full Diet recommendation: Heart Healthy   Brief/Interim Summary: 19 y.o. female past medical history of asthma and depression comes to the ED after becoming acutely nauseated and dizzy and short of breath after visiting church, when she got home she took a couple of Advil and Compazine for nausea the symptoms persisted so she came into the ED where she was found in sinus tachycardia with a QTC of 456 QRS of 101 - D-dimer, potassium of 3.1 and anion gap of 18 with a bicarb of 14 taking any new medications  Discharge Diagnoses:  Principal Problem:   Tachycardia Active Problems:   Asthma   Depression  Dizziness likely due to sinus tachycardia: Of unclear etiology, she has normal reflexes, orthostatics were normal, at the admitting physician discussed with poison control and not compatible with serotonin syndrome. Her sertraline, Imitrex, lorazepam and Compazine were held. She will resume it as an outpatient, TSH were negative HIV was negative. Her TSH was unremarkable.  High anion gap metabolic acidosis: With an anion gap of 18, her lactic acid was normal there were no suspicious for alcohol consumption her ethanol level was negative. She denies any suicidal thoughts. Resolved with IV fluid hydration.  Hypokalemia: Magnesium was checked and potassium was repleted orally. Discharge Instructions  Discharge Instructions    Diet - low sodium heart healthy   Complete by: As directed    Increase activity slowly   Complete by: As directed      Allergies as of 08/11/2019      Reactions   Augmentin [amoxicillin-pot  Clavulanate] Diarrhea   Morphine And Related    Metal taste      Medication List    STOP taking these medications   cephALEXin 500 MG capsule Commonly known as: KEFLEX     TAKE these medications   acetaminophen 325 MG tablet Commonly known as: Tylenol Take 2 tablets (650 mg total) by mouth every 6 (six) hours as needed for mild pain.   albuterol 108 (90 Base) MCG/ACT inhaler Commonly known as: VENTOLIN HFA Inhale into the lungs every 6 (six) hours as needed for wheezing or shortness of breath.   ALPRAZolam 1 MG tablet Commonly known as: XANAX Take 1 mg by mouth at bedtime as needed for anxiety.   CLEAR EYES OP Place 1 drop into both eyes daily as needed (For dry eyes).   famotidine 20 MG tablet Commonly known as: Pepcid Take 1 tablet (20 mg total) by mouth 2 (two) times daily.   ibuprofen 600 MG tablet Commonly known as: ADVIL Take 1 tablet (600 mg total) by mouth every 6 (six) hours as needed.   LORazepam 1 MG tablet Commonly known as: Ativan Take 1 tablet (1 mg total) by mouth every 8 (eight) hours as needed for anxiety.   omeprazole 20 MG capsule Commonly known as: PRILOSEC Take 20 mg by mouth daily.   ondansetron 4 MG tablet Commonly known as: ZOFRAN Take 1 tablet (4 mg total) by mouth every 6 (six) hours as needed for nausea.   pantoprazole 40 MG tablet Commonly known as: PROTONIX Take 1 tablet (40 mg total) by mouth 2 (two) times daily for 14 days.   PROBIOTIC  PO Take 1 tablet by mouth daily.   prochlorperazine 5 MG tablet Commonly known as: COMPAZINE Take 5 mg by mouth every 8 (eight) hours as needed for nausea.   Qvar RediHaler 80 MCG/ACT inhaler Generic drug: beclomethasone Inhale 1 puff into the lungs 2 (two) times daily.   sertraline 25 MG tablet Commonly known as: ZOLOFT TAKE 3 TABLETS BY MOUTH  (75MG ) EVERY MORNING What changed: See the new instructions.   SUMAtriptan 25 MG tablet Commonly known as: IMITREX Take 25 mg by mouth as  needed for migraine.       Allergies  Allergen Reactions  . Augmentin [Amoxicillin-Pot Clavulanate] Diarrhea  . Morphine And Related     Metal taste    Consultations:  None   Procedures/Studies: DG Chest Portable 1 View  Result Date: 08/09/2019 CLINICAL DATA:  Shortness of breath for 2 days, worsening today now with sore throat and fever EXAM: PORTABLE CHEST 1 VIEW COMPARISON:  Radiograph 06/03/2018 FINDINGS: No consolidation, features of edema, pneumothorax, or effusion. Pulmonary vascularity is normally distributed. The cardiomediastinal contours are unremarkable. No acute osseous or soft tissue abnormality. Telemetry leads and mask wire project over the chest and base of the neck. IMPRESSION: No acute cardiopulmonary abnormality. Electronically Signed   By: Lovena Le M.D.   On: 08/09/2019 21:27    (Echo, Carotid, EGD, Colonoscopy, ERCP)    Subjective: No complaints she feels great tolerating her diet.  Discharge Exam: Vitals:   08/11/19 0000 08/11/19 0521  BP: 110/88 98/65  Pulse: 77 62  Resp: 18 18  Temp: 97.9 F (36.6 C) 97.8 F (36.6 C)  SpO2: 98% 98%   Vitals:   08/10/19 1819 08/10/19 1945 08/11/19 0000 08/11/19 0521  BP: 113/74  110/88 98/65  Pulse: 98  77 62  Resp: 18  18 18   Temp: (!) 97.5 F (36.4 C)  97.9 F (36.6 C) 97.8 F (36.6 C)  TempSrc: Oral  Oral Oral  SpO2: 100% 99% 98% 98%  Weight:      Height:        General: Pt is alert, awake, not in acute distress Cardiovascular: RRR, S1/S2 +, no rubs, no gallops Respiratory: CTA bilaterally, no wheezing, no rhonchi Abdominal: Soft, NT, ND, bowel sounds + Extremities: no edema, no cyanosis    The results of significant diagnostics from this hospitalization (including imaging, microbiology, ancillary and laboratory) are listed below for reference.     Microbiology: Recent Results (from the past 240 hour(s))  SARS Coronavirus 2 by RT PCR (hospital order, performed in [facility] hospital  lab) Nasopharyngeal Throat     Status: None   Collection Time: 08/09/19  9:17 PM   Specimen: Throat; Nasopharyngeal  Result Value Ref Range Status   SARS Coronavirus 2 NEGATIVE NEGATIVE Final    Comment: (NOTE) SARS-CoV-2 target nucleic acids are NOT DETECTED.  The SARS-CoV-2 RNA is generally detectable in upper and lower respiratory specimens during the acute phase of infection. The lowest concentration of SARS-CoV-2 viral copies this assay can detect is 250 copies / mL. A negative result does not preclude SARS-CoV-2 infection and should not be used as the sole basis for treatment or other patient management decisions.  A negative result may occur with improper specimen collection / handling, submission of specimen other than nasopharyngeal swab, presence of viral mutation(s) within the areas targeted by this assay, and inadequate number of viral copies (<250 copies / mL). A negative result must be combined with clinical observations, patient history, and epidemiological  information.  Fact Sheet for Patients:   StrictlyIdeas.no  Fact Sheet for Healthcare Providers: BankingDealers.co.za  This test is not yet approved or  cleared by the [place] FDA and has been authorized for detection and/or diagnosis of SARS-CoV-2 by FDA under an Emergency Use Authorization (EUA).  This EUA will remain in effect (meaning this test can be used) for the duration of the COVID-19 declaration under Section 564(b)(1) of the Act, 21 U.S.C. section 360bbb-3(b)(1), unless the authorization is terminated or revoked sooner.  Performed at [facility], [place] [address] [place], [place] [address]   Group A Strep by PCR     Status: None   Collection Time: 08/09/19  9:17 PM   Specimen: Throat; Sterile Swab  Result Value Ref Range Status   Group A Strep by PCR NOT DETECTED NOT DETECTED Final    Comment: Performed at [facility], [place]  [address] [place], [place] [address]     Labs: BNP (last 3 results) No results for input(s): BNP in the last 8760 hours. Basic Metabolic Panel: Recent Labs  Lab 08/09/19 2108 08/10/19 0136 08/10/19 0500 08/11/19 0628  NA 140 144 141 140  K 3.1* 2.9* 4.1 3.8  CL 108  --  116* 109  CO2 14*  --  19* 24  GLUCOSE 117*  --  151* 97  BUN 6  --  <5* <5*  CREATININE 0.91  --  0.54 0.77  CALCIUM 9.7  --  7.9* 8.3*  MG  --   --  1.7  --    Liver Function Tests: Recent Labs  Lab 08/10/19 0500  AST 15  ALT 12  ALKPHOS 46  BILITOT 0.6  PROT 5.6*  ALBUMIN 3.1*   No results for input(s): LIPASE, AMYLASE in the last 168 hours. No results for input(s): AMMONIA in the last 168 hours. CBC: Recent Labs  Lab 08/09/19 2108 08/10/19 0136 08/10/19 0500  WBC 6.6  --  2.8*  NEUTROABS 3.6  --  2.5  HGB 15.5* 12.2 12.3  HCT 44.7 36.0 37.1  MCV 92.5  --  96.1  PLT 259  --  171   Cardiac Enzymes: Recent Labs  Lab 08/10/19 0500  CKTOTAL 54   BNP: Invalid input(s): POCBNP CBG: No results for input(s): GLUCAP in the last 168 hours. D-Dimer Recent Labs    08/09/19 2108  DDIMER 0.40   Hgb A1c No results for input(s): HGBA1C in the last 72 hours. Lipid Profile No results for input(s): CHOL, HDL, LDLCALC, TRIG, CHOLHDL, LDLDIRECT in the last 72 hours. Thyroid function studies Recent Labs    08/10/19 0502  TSH 1.100   Anemia work up No results for input(s): VITAMINB12, FOLATE, FERRITIN, TIBC, IRON, RETICCTPCT in the last 72 hours. Urinalysis    Component Value Date/Time   COLORURINE COLORLESS (A) 06/03/2018 0746   APPEARANCEUR CLEAR 06/03/2018 0746   LABSPEC 1.004 (L) 06/03/2018 0746   PHURINE 7.0 06/03/2018 0746   GLUCOSEU NEGATIVE 06/03/2018 0746   HGBUR NEGATIVE 06/03/2018 0746   BILIRUBINUR NEGATIVE 06/03/2018 0746   KETONESUR 5 (A) 06/03/2018 0746   PROTEINUR NEGATIVE 06/03/2018 0746   UROBILINOGEN 0.2 04/21/2013 2054   NITRITE NEGATIVE 06/03/2018 0746   LEUKOCYTESUR  SMALL (A) 06/03/2018 0746   Sepsis Labs Invalid input(s): PROCALCITONIN,  WBC,  LACTICIDVEN Microbiology Recent Results (from the past 240 hour(s))  SARS Coronavirus 2 by RT PCR (hospital order, performed in [facility] hospital lab) Nasopharyngeal Throat  Status: None   Collection Time: 08/09/19  9:17 PM   Specimen: Throat; Nasopharyngeal  Result Value Ref Range Status   SARS Coronavirus 2 NEGATIVE NEGATIVE Final    Comment: (NOTE) SARS-CoV-2 target nucleic acids are NOT DETECTED.  The SARS-CoV-2 RNA is generally detectable in upper and lower respiratory specimens during the acute phase of infection. The lowest concentration of SARS-CoV-2 viral copies this assay can detect is 250 copies / mL. A negative result does not preclude SARS-CoV-2 infection and should not be used as the sole basis for treatment or other patient management decisions.  A negative result may occur with improper specimen collection / handling, submission of specimen other than nasopharyngeal swab, presence of viral mutation(s) within the areas targeted by this assay, and inadequate number of viral copies (<250 copies / mL). A negative result must be combined with clinical observations, patient history, and epidemiological information.  Fact Sheet for Patients:   StrictlyIdeas.no  Fact Sheet for Healthcare Providers: BankingDealers.co.za  This test is not yet approved or  cleared by the [place] FDA and has been authorized for detection and/or diagnosis of SARS-CoV-2 by FDA under an Emergency Use Authorization (EUA).  This EUA will remain in effect (meaning this test can be used) for the duration of the COVID-19 declaration under Section 564(b)(1) of the Act, 21 U.S.C. section 360bbb-3(b)(1), unless the authorization is terminated or revoked sooner.  Performed at [facility], [place] [address] [place], [place] [address]   Group A Strep by PCR      Status: None   Collection Time: 08/09/19  9:17 PM   Specimen: Throat; Sterile Swab  Result Value Ref Range Status   Group A Strep by PCR NOT DETECTED NOT DETECTED Final    Comment: Performed at [facility], [place] [address], [place], [place] [address]     Time coordinating discharge: Over 30 minutes  SIGNED:   Charlynne Cousins, MD  [facility] 08/11/2019, 10:17 AM Pager   If 7PM-7AM, please contact night-coverage [email or web address] Password [email or web address]

## 2019-08-11 NOTE — Progress Notes (Signed)
Pt back from CT

## 2019-08-11 NOTE — Progress Notes (Signed)
Dr. Olevia Bowens made aware of patient still complaining of severe abdominal pain. New orders received and will continue to monitor.

## 2019-08-12 DIAGNOSIS — F410 Panic disorder [episodic paroxysmal anxiety] without agoraphobia: Secondary | ICD-10-CM | POA: Diagnosis present

## 2019-08-12 DIAGNOSIS — F329 Major depressive disorder, single episode, unspecified: Secondary | ICD-10-CM | POA: Diagnosis present

## 2019-08-12 DIAGNOSIS — Z800 Family history of malignant neoplasm of digestive organs: Secondary | ICD-10-CM | POA: Diagnosis not present

## 2019-08-12 DIAGNOSIS — K529 Noninfective gastroenteritis and colitis, unspecified: Secondary | ICD-10-CM

## 2019-08-12 DIAGNOSIS — E872 Acidosis: Secondary | ICD-10-CM | POA: Diagnosis present

## 2019-08-12 DIAGNOSIS — R0600 Dyspnea, unspecified: Secondary | ICD-10-CM

## 2019-08-12 DIAGNOSIS — E876 Hypokalemia: Secondary | ICD-10-CM | POA: Diagnosis present

## 2019-08-12 DIAGNOSIS — J4520 Mild intermittent asthma, uncomplicated: Secondary | ICD-10-CM

## 2019-08-12 DIAGNOSIS — K649 Unspecified hemorrhoids: Secondary | ICD-10-CM | POA: Diagnosis present

## 2019-08-12 DIAGNOSIS — Z79899 Other long term (current) drug therapy: Secondary | ICD-10-CM | POA: Diagnosis not present

## 2019-08-12 DIAGNOSIS — F419 Anxiety disorder, unspecified: Secondary | ICD-10-CM | POA: Diagnosis present

## 2019-08-12 DIAGNOSIS — N9489 Other specified conditions associated with female genital organs and menstrual cycle: Secondary | ICD-10-CM | POA: Diagnosis present

## 2019-08-12 DIAGNOSIS — Z975 Presence of (intrauterine) contraceptive device: Secondary | ICD-10-CM | POA: Diagnosis not present

## 2019-08-12 DIAGNOSIS — F603 Borderline personality disorder: Secondary | ICD-10-CM | POA: Diagnosis present

## 2019-08-12 DIAGNOSIS — G8929 Other chronic pain: Secondary | ICD-10-CM | POA: Diagnosis present

## 2019-08-12 DIAGNOSIS — F320 Major depressive disorder, single episode, mild: Secondary | ICD-10-CM

## 2019-08-12 DIAGNOSIS — Q438 Other specified congenital malformations of intestine: Secondary | ICD-10-CM | POA: Diagnosis not present

## 2019-08-12 DIAGNOSIS — Z803 Family history of malignant neoplasm of breast: Secondary | ICD-10-CM | POA: Diagnosis not present

## 2019-08-12 DIAGNOSIS — Z818 Family history of other mental and behavioral disorders: Secondary | ICD-10-CM | POA: Diagnosis not present

## 2019-08-12 DIAGNOSIS — R000 Tachycardia, unspecified: Secondary | ICD-10-CM | POA: Diagnosis present

## 2019-08-12 DIAGNOSIS — J4541 Moderate persistent asthma with (acute) exacerbation: Secondary | ICD-10-CM | POA: Diagnosis present

## 2019-08-12 DIAGNOSIS — G43909 Migraine, unspecified, not intractable, without status migrainosus: Secondary | ICD-10-CM | POA: Diagnosis present

## 2019-08-12 DIAGNOSIS — K580 Irritable bowel syndrome with diarrhea: Secondary | ICD-10-CM | POA: Diagnosis present

## 2019-08-12 DIAGNOSIS — K219 Gastro-esophageal reflux disease without esophagitis: Secondary | ICD-10-CM | POA: Diagnosis present

## 2019-08-12 DIAGNOSIS — K5900 Constipation, unspecified: Secondary | ICD-10-CM

## 2019-08-12 DIAGNOSIS — R109 Unspecified abdominal pain: Secondary | ICD-10-CM | POA: Diagnosis present

## 2019-08-12 DIAGNOSIS — N898 Other specified noninflammatory disorders of vagina: Secondary | ICD-10-CM | POA: Diagnosis present

## 2019-08-12 DIAGNOSIS — K6389 Other specified diseases of intestine: Secondary | ICD-10-CM | POA: Diagnosis present

## 2019-08-12 DIAGNOSIS — E873 Alkalosis: Secondary | ICD-10-CM | POA: Diagnosis present

## 2019-08-12 DIAGNOSIS — F322 Major depressive disorder, single episode, severe without psychotic features: Secondary | ICD-10-CM | POA: Diagnosis not present

## 2019-08-12 DIAGNOSIS — Z20822 Contact with and (suspected) exposure to covid-19: Secondary | ICD-10-CM | POA: Diagnosis present

## 2019-08-12 DIAGNOSIS — R1084 Generalized abdominal pain: Secondary | ICD-10-CM | POA: Diagnosis not present

## 2019-08-12 LAB — COMPREHENSIVE METABOLIC PANEL
ALT: 19 U/L (ref 0–44)
AST: 18 U/L (ref 15–41)
Albumin: 3.8 g/dL (ref 3.5–5.0)
Alkaline Phosphatase: 56 U/L (ref 38–126)
Anion gap: 9 (ref 5–15)
BUN: 6 mg/dL (ref 6–20)
CO2: 22 mmol/L (ref 22–32)
Calcium: 9 mg/dL (ref 8.9–10.3)
Chloride: 109 mmol/L (ref 98–111)
Creatinine, Ser: 0.88 mg/dL (ref 0.44–1.00)
GFR calc Af Amer: >60 mL/min (ref >60)
GFR calc non Af Amer: >60 mL/min (ref >60)
Glucose, Bld: 89 mg/dL (ref 70–99)
Potassium: 4.1 mmol/L (ref 3.5–5.1)
Sodium: 140 mmol/L (ref 135–145)
Total Bilirubin: 0.9 mg/dL (ref 0.3–1.2)
Total Protein: 6.5 g/dL (ref 6.5–8.1)

## 2019-08-12 LAB — PHOSPHORUS: Phosphorus: 5.5 mg/dL — ABNORMAL HIGH (ref 2.5–4.6)

## 2019-08-12 LAB — MAGNESIUM: Magnesium: 2 mg/dL (ref 1.7–2.4)

## 2019-08-12 MED ORDER — SORBITOL 70 % SOLN
960.0000 mL | TOPICAL_OIL | Freq: Once | ORAL | Status: AC
  Administered 2019-08-12: 960 mL via RECTAL
  Filled 2019-08-12: qty 473

## 2019-08-12 MED ORDER — MAGNESIUM CITRATE PO SOLN
0.5000 | Freq: Once | ORAL | Status: AC
  Administered 2019-08-12: 0.5 via ORAL
  Filled 2019-08-12: qty 296

## 2019-08-12 MED ORDER — SODIUM CHLORIDE 0.9 % IV SOLN: INTRAVENOUS | Status: DC

## 2019-08-12 MED ORDER — MAGNESIUM CITRATE PO SOLN: 1.0000 | Freq: Once | ORAL | Status: DC

## 2019-08-12 NOTE — Progress Notes (Signed)
PROGRESS NOTE    Candace King  WUJ:[id number] DOB: 2000-10-26 DOA: 08/09/2019 PCP: Judithann Sauger, MD     Brief Narrative:  Candace King is a 19 y.o. WF PMHx Asthma and depression, IBD (seen by Dr. Paulita Fujita GI)  Presents to the ER after patient became acutely nauseated with dizziness and short of breath after visiting church yesterday around 11 AM.  Patient states that she had taken the bread which usually is dipped in grape juice during the holy communion but looks like yesterday it was dipped in the wine and she had taken it for the first time.  5 minutes later patient became nauseated started feeling dizzy not feeling well and became short of breath.  At home she also had a temperature for which she took Advil.  Patient also took 1 dose of prochlorperazine for nausea at home.  Since patient symptoms persisted patient came to the ER.  ED Course: In the ER patient easily becomes tachycardic on movement.  EKG shows sinus tachycardia with QTC of 456 ms and QRS of 101 ms.  Labs show negative D-dimer with potassium of 3.1.  Anion gap is 18 bicarb is 14.  Patient states her oral medications including sertraline, as needed Xanax Qvar and albuterol are all has been taking for many years.  Denies taking any over-the-counter medications.  Patient was given IV Solu-Medrol for possible asthma exacerbation with nebulizer and also prochlorperazine for nausea.  Patient was started on fluids potassium replacement admitted for further observation given the persistent tachycardia.   Subjective: A/O x4, negative CP, positive continued abdominal pain generalized.  Patient states has history of IBD with frequent constipation.  Mother states that family history of uncles with early onset colon cancer (12 years old).  In addition multiple family members with IBD.   Assessment & Plan: Covid vaccination;   Principal Problem:   Tachycardia Active Problems:   Asthma   Depression  Abdominal  pain/constipation -7/7 SMOG enema + 1/2 bottle of magnesium citrate.  If after patient clears while still having discomfort will consult Dr. Paulita Fujita GI.  Patient may require EGD/colonoscopy although very young for that procedure. 7/5 urine tox screen negative -TSH WNL -7/5 UDS negative -Normal saline 47ml/hr  High anion gap metabolic acidosis -Resolved  Asthma -Currently not an issue  Depression/Anxiety -Xanax 1 mg PRN -Zoloft 75 mg daily  Migraine -Home regimen  Prochlorperazine    DVT prophylaxis: Lovenox Code Status: Full Family Communication: 7/7 mother present at bedside for discussion of plan of care Status is: Inpatient    Dispo: The patient is from: Home              Anticipated d/c is to: Home              Anticipated d/c date is: 7/9              Patient currently unstable     Consultants:    Procedures/Significant Events:  7/6 CT abdomen pelvis W contrast; negative acute abnormality -Moderate colonic stool burden possible constipation. -Prominent left adnexal vascularity with minimal reflux contrast in dilatation of the ovarian vein can be seen with pelvic congestion syndrome in the appropriate clinical setting    I have personally reviewed and interpreted all radiology studies and my findings are as above.  VENTILATOR SETTINGS:    Cultures 7/4 SARS coronavirus negative 7/4 group A strep by PCR negative 7/5 HIV negative   Antimicrobials:    Devices    LINES /  TUBES:      Continuous Infusions:   Objective: Vitals:   08/11/19 1944 08/11/19 2355 08/12/19 0434 08/12/19 0731  BP:  114/81 108/82   Pulse:  84 76   Resp:  16 18   Temp:  97.6 F (36.4 C) 97.6 F (36.4 C)   TempSrc:  Oral Oral   SpO2: 99% 98% 99% 98%  Weight:      Height:       No intake or output data in the 24 hours ending 08/12/19 1336 Filed Weights   08/09/19 2106  Weight: 56.7 kg    Examination:  General: A/O x4, no acute respiratory distress,  appears uncomfortable Eyes: negative scleral hemorrhage, negative anisocoria, negative icterus ENT: Negative Runny nose, negative gingival bleeding, Neck:  Negative scars, masses, torticollis, lymphadenopathy, JVD Lungs: Clear to auscultation bilaterally without wheezes or crackles Cardiovascular: Regular rate and rhythm without murmur gallop or rub normal S1 and S2 Abdomen: Positive generalized abdominal pain to palpation, nondistended, positive hypoactive, bowel sounds, no rebound, no ascites, no appreciable mass Extremities: No significant cyanosis, clubbing, or edema bilateral lower extremities Skin: Negative rashes, lesions, ulcers Psychiatric:  Negative depression, negative anxiety, negative fatigue, negative mania  Central nervous system:  Cranial nerves II through XII intact, tongue/uvula midline, all extremities muscle strength 5/5, sensation intact throughout, negative dysarthria, negative expressive aphasia, negative receptive aphasia.  .     Data Reviewed: Care during the described time interval was provided by me .  I have reviewed this patient's available data, including medical history, events of note, physical examination, and all test results as part of my evaluation.  CBC: Recent Labs  Lab 08/09/19 2108 08/10/19 0136 08/10/19 0500 08/11/19 1747  WBC 6.6  --  2.8* 6.6  NEUTROABS 3.6  --  2.5 4.0  HGB 15.5* 12.2 12.3 14.2  HCT 44.7 36.0 37.1 40.2  MCV 92.5  --  96.1 93.1  PLT 259  --  171 536   Basic Metabolic Panel: Recent Labs  Lab 08/09/19 2108 08/10/19 0136 08/10/19 0500 08/11/19 0628  NA 140 144 141 140  K 3.1* 2.9* 4.1 3.8  CL 108  --  116* 109  CO2 14*  --  19* 24  GLUCOSE 117*  --  151* 97  BUN 6  --  <5* <5*  CREATININE 0.91  --  0.54 0.77  CALCIUM 9.7  --  7.9* 8.3*  MG  --   --  1.7  --    GFR: Estimated Creatinine Clearance: 97.7 mL/min (by C-G formula based on SCr of 0.77 mg/dL). Liver Function Tests: Recent Labs  Lab 08/10/19 0500    AST 15  ALT 12  ALKPHOS 46  BILITOT 0.6  PROT 5.6*  ALBUMIN 3.1*   No results for input(s): LIPASE, AMYLASE in the last 168 hours. No results for input(s): AMMONIA in the last 168 hours. Coagulation Profile: No results for input(s): INR, PROTIME in the last 168 hours. Cardiac Enzymes: Recent Labs  Lab 08/10/19 0500  CKTOTAL 54   BNP (last 3 results) No results for input(s): PROBNP in the last 8760 hours. HbA1C: No results for input(s): HGBA1C in the last 72 hours. CBG: No results for input(s): GLUCAP in the last 168 hours. Lipid Profile: No results for input(s): CHOL, HDL, LDLCALC, TRIG, CHOLHDL, LDLDIRECT in the last 72 hours. Thyroid Function Tests: Recent Labs    08/10/19 0502  TSH 1.100   Anemia Panel: No results for input(s): VITAMINB12, FOLATE, FERRITIN, TIBC, IRON, RETICCTPCT  in the last 72 hours. Sepsis Labs: Recent Labs  Lab 08/10/19 1601  LATICACIDVEN 1.1    Recent Results (from the past 240 hour(s))  SARS Coronavirus 2 by RT PCR (hospital order, performed in [facility] hospital lab) Nasopharyngeal Throat     Status: None   Collection Time: 08/09/19  9:17 PM   Specimen: Throat; Nasopharyngeal  Result Value Ref Range Status   SARS Coronavirus 2 NEGATIVE NEGATIVE Final    Comment: (NOTE) SARS-CoV-2 target nucleic acids are NOT DETECTED.  The SARS-CoV-2 RNA is generally detectable in upper and lower respiratory specimens during the acute phase of infection. The lowest concentration of SARS-CoV-2 viral copies this assay can detect is 250 copies / mL. A negative result does not preclude SARS-CoV-2 infection and should not be used as the sole basis for treatment or other patient management decisions.  A negative result may occur with improper specimen collection / handling, submission of specimen other than nasopharyngeal swab, presence of viral mutation(s) within the areas targeted by this assay, and inadequate number of viral copies (<250 copies /  mL). A negative result must be combined with clinical observations, patient history, and epidemiological information.  Fact Sheet for Patients:   StrictlyIdeas.no  Fact Sheet for Healthcare Providers: BankingDealers.co.za  This test is not yet approved or  cleared by the [place] FDA and has been authorized for detection and/or diagnosis of SARS-CoV-2 by FDA under an Emergency Use Authorization (EUA).  This EUA will remain in effect (meaning this test can be used) for the duration of the COVID-19 declaration under Section 564(b)(1) of the Act, 21 U.S.C. section 360bbb-3(b)(1), unless the authorization is terminated or revoked sooner.  Performed at [facility], [place] [address] [place], [place] [address]   Group A Strep by PCR     Status: None   Collection Time: 08/09/19  9:17 PM   Specimen: Throat; Sterile Swab  Result Value Ref Range Status   Group A Strep by PCR NOT DETECTED NOT DETECTED Final    Comment: Performed at [facility], [place] [address], [place], [place] [address]         Radiology Studies: CT ABDOMEN PELVIS W CONTRAST  Result Date: 08/11/2019 CLINICAL DATA:  Abdominal pain and cramping. EXAM: CT ABDOMEN AND PELVIS WITH CONTRAST TECHNIQUE: Multidetector CT imaging of the abdomen and pelvis was performed using the standard protocol following bolus administration of intravenous contrast. CONTRAST:  146mL OMNIPAQUE IOHEXOL 300 MG/ML  SOLN COMPARISON:  Abdominal CT and pelvic ultrasound 01/14/2018. FINDINGS: Lower chest: The lung bases are clear. Hepatobiliary: No focal liver abnormality is seen. No gallstones, gallbladder wall thickening, or biliary dilatation. Pancreas: No ductal dilatation or inflammation. Spleen: 5 mm hypodensity in the superior spleen is nonspecific but typically benign. No splenomegaly. Adrenals/Urinary Tract: Normal adrenal glands. No hydronephrosis or perinephric edema. Homogeneous renal  enhancement. Urinary bladder is physiologically distended without wall thickening. Stomach/Bowel: Stomach is decompressed, unremarkable. Normal positioning of the duodenum and ligament of Treitz. There is no small bowel obstruction or inflammatory change. Administered enteric contrast reaches the distal colon. Appendix is air and contrast filled and normal. Moderate volume of stool throughout the colon. There is mild colonic tortuosity. No colonic wall thickening. Vascular/Lymphatic: Normal caliber abdominal aorta. Patent portal vein. Circumaortic left renal vein. Minimal reflux of contrast and dilatation of the gonadal vein, 5 mm on the left. No adenopathy. Reproductive: IUD appropriately position in the uterus which is unremarkable. Physiologic appearance of the ovaries. Slight  increased left adnexal and periuterine vascularity. Other: Tiny fat containing umbilical hernia. No free air, free fluid, or intra-abdominal fluid collection. Musculoskeletal: There are no acute or suspicious osseous abnormalities. Hemi transitional lumbosacral anatomy. IMPRESSION: 1. No acute abnormality in the abdomen/pelvis. 2. Moderate colonic stool burden with colonic tortuosity, possible constipation. 3. Prominent left adnexal vascularity with minimal reflux contrast in dilatation of the ovarian vein can be seen with pelvic congestion syndrome in the appropriate clinical setting. Electronically Signed   By: Keith Rake M.D.   On: 08/11/2019 23:52        Scheduled Meds: . budesonide  0.25 mg Inhalation BID  . enoxaparin (LOVENOX) injection  40 mg Subcutaneous Daily  . pantoprazole  40 mg Oral BID  . sertraline  75 mg Oral Daily   Continuous Infusions:   LOS: 0 days    Time spent:40 min    Pavan Bring, Geraldo Docker, MD [facility] Pager [phone]  If 7PM-7AM, please contact night-coverage [email or web address] Password [email or web address] 08/12/2019, 1:36 PM

## 2019-08-13 ENCOUNTER — Inpatient Hospital Stay (HOSPITAL_COMMUNITY): Payer: 59

## 2019-08-13 DIAGNOSIS — F322 Major depressive disorder, single episode, severe without psychotic features: Secondary | ICD-10-CM

## 2019-08-13 DIAGNOSIS — N9489 Other specified conditions associated with female genital organs and menstrual cycle: Secondary | ICD-10-CM | POA: Diagnosis present

## 2019-08-13 DIAGNOSIS — R1084 Generalized abdominal pain: Secondary | ICD-10-CM

## 2019-08-13 LAB — COMPREHENSIVE METABOLIC PANEL
ALT: 18 U/L (ref 0–44)
AST: 17 U/L (ref 15–41)
Albumin: 3.5 g/dL (ref 3.5–5.0)
Alkaline Phosphatase: 56 U/L (ref 38–126)
Anion gap: 8 (ref 5–15)
BUN: 7 mg/dL (ref 6–20)
CO2: 23 mmol/L (ref 22–32)
Calcium: 8.5 mg/dL — ABNORMAL LOW (ref 8.9–10.3)
Chloride: 109 mmol/L (ref 98–111)
Creatinine, Ser: 0.88 mg/dL (ref 0.44–1.00)
GFR calc Af Amer: >60 mL/min (ref >60)
GFR calc non Af Amer: >60 mL/min (ref >60)
Glucose, Bld: 90 mg/dL (ref 70–99)
Potassium: 3.9 mmol/L (ref 3.5–5.1)
Sodium: 140 mmol/L (ref 135–145)
Total Bilirubin: 0.8 mg/dL (ref 0.3–1.2)
Total Protein: 5.9 g/dL — ABNORMAL LOW (ref 6.5–8.1)

## 2019-08-13 LAB — CBC WITH DIFFERENTIAL/PLATELET
Abs Immature Granulocytes: 0.02 10*3/uL (ref 0.00–0.07)
Basophils Absolute: 0.1 10*3/uL (ref 0.0–0.1)
Basophils Relative: 1 %
Eosinophils Absolute: 0.1 10*3/uL (ref 0.0–0.5)
Eosinophils Relative: 2 %
HCT: 41.1 % (ref 36.0–46.0)
Hemoglobin: 14.3 g/dL (ref 12.0–15.0)
Immature Granulocytes: 0 %
Lymphocytes Relative: 36 %
Lymphs Abs: 2.5 10*3/uL (ref 0.7–4.0)
MCH: 32.5 pg (ref 26.0–34.0)
MCHC: 34.8 g/dL (ref 30.0–36.0)
MCV: 93.4 fL (ref 80.0–100.0)
Monocytes Absolute: 0.6 10*3/uL (ref 0.1–1.0)
Monocytes Relative: 9 %
Neutro Abs: 3.7 10*3/uL (ref 1.7–7.7)
Neutrophils Relative %: 52 %
Platelets: 229 10*3/uL (ref 150–400)
RBC: 4.4 MIL/uL (ref 3.87–5.11)
RDW: 11.9 % (ref 11.5–15.5)
WBC: 7.1 10*3/uL (ref 4.0–10.5)
nRBC: 0 % (ref 0.0–0.2)

## 2019-08-13 LAB — PHOSPHORUS: Phosphorus: 4.9 mg/dL — ABNORMAL HIGH (ref 2.5–4.6)

## 2019-08-13 LAB — MAGNESIUM: Magnesium: 2.8 mg/dL — ABNORMAL HIGH (ref 1.7–2.4)

## 2019-08-13 MED ORDER — POLYETHYLENE GLYCOL 3350 17 GM/SCOOP PO POWD
1.0000 | Freq: Once | ORAL | Status: AC
  Administered 2019-08-13: 255 g via ORAL
  Filled 2019-08-13: qty 255

## 2019-08-13 MED ORDER — DICYCLOMINE HCL 10 MG PO CAPS
10.0000 mg | ORAL_CAPSULE | Freq: Three times a day (TID) | ORAL | Status: AC
  Administered 2019-08-13 (×3): 10 mg via ORAL
  Filled 2019-08-13 (×4): qty 1

## 2019-08-13 MED ORDER — DICYCLOMINE HCL 10 MG PO CAPS
10.0000 mg | ORAL_CAPSULE | Freq: Four times a day (QID) | ORAL | Status: DC | PRN
  Administered 2019-08-14 (×2): 10 mg via ORAL
  Filled 2019-08-13 (×2): qty 1

## 2019-08-13 NOTE — Progress Notes (Signed)
Chaplain visited as result of a request from outside clergy. The chaplain stepped into the room but a physician came in to talk with the patient about a procedure. The chaplain will follow-up later today.  Brion Aliment Chaplain Resident For questions concerning this note please contact me by pager [phone]

## 2019-08-13 NOTE — Progress Notes (Signed)
The chaplain visited as a follow-up. The patient expressed how they haven't fully processed their diagnosis. The patient is very hopeful concerning how to handle the diagnosis and ways in which they can lessen the flare ups and pain. The Patient also wondered why she had this happen to her. The patient also talked about a recent church experience that reminded her of her grandfather who died in April 22, 2022. The patient and the family expressed an outpouring of support when they really needed it from places they did not expect it recently. This points to a good support system and a sense of faith that will be useful in coping with their spiritual, physical and emotional challenges.The patient and the family were both very grateful for the visit from the chaplain. The chaplain does not need to follow-up unless directly requested.  Brion Aliment Chaplain Resident For questions concerning this note please contact me by pager [phone]

## 2019-08-13 NOTE — Consult Note (Addendum)
Referring Provider: Dr. Dia Crawford Primary Care Physician:  Judithann Sauger, MD Primary Gastroenterologist:  Dr. Paulita Fujita [facility])  Reason for Consultation:  Abdominal pain  HPI: Candace King is a 19 y.o. female with history of GERD and chronic abdominal pain presenting for consultation of acute on chronic abdominal pain.  Patient reports worsening abdominal pain over the last week.  She has had pain mostly in the lower quadrants that she does note some upper abdominal discomfort/indigestion.  She reports nausea but denies any vomiting.  She states that her lower abdominal discomfort is painful even when moving.  She is also experiencing cramping, currently at an 8 of 10.  Patient also reports recent constipation, though notes she has episodes of diarrhea as well.  Denies any rectal bleeding or melena.  She was given a smog enema with some relief in her symptoms, though does still note some right lower quadrant and left lower quadrant abdominal pain, as well as pelvic pain.  She further states she has had decreased appetite and weight loss of approximately 10 pounds over the last month.  She has GERD for which she takes omeprazole but is still noticing breakthrough symptoms.    Patient further notes that she has been having increased vaginal discharge/spotting, despite Mirena IUD.  She also has pelvic pain and cramping.  Denies any family history of inflammatory bowel disease, though does note that she has an aunt with IBS.  Has two maternal great uncles who had colon cancer diagnosed in their 28s.  Also notes several family members with "tortuous colon."  Past Medical History:  Diagnosis Date  . Asthma   . Telangiectasia     Past Surgical History:  Procedure Laterality Date  . TONSILLECTOMY      Prior to Admission medications   Medication Sig Start Date End Date Taking? Authorizing Provider  albuterol (PROVENTIL HFA;VENTOLIN HFA) 108 (90 Base) MCG/ACT inhaler Inhale into the  lungs every 6 (six) hours as needed for wheezing or shortness of breath.   Yes [provider]  ALPRAZolam Duanne Moron) 1 MG tablet Take 1 mg by mouth at bedtime as needed for anxiety.   Yes [provider]  Naphazoline HCl (CLEAR EYES OP) Place 1 drop into both eyes daily as needed (For dry eyes).   Yes [provider]  omeprazole (PRILOSEC) 20 MG capsule Take 20 mg by mouth daily. 08/08/17  Yes [provider]  Probiotic Product (PROBIOTIC PO) Take 1 tablet by mouth daily.   Yes [provider]  prochlorperazine (COMPAZINE) 5 MG tablet Take 5 mg by mouth every 8 (eight) hours as needed for nausea. 04/15/18  Yes [provider]  QVAR REDIHALER 80 MCG/ACT inhaler Inhale 1 puff into the lungs 2 (two) times daily.  01/14/18  Yes [provider]  sertraline (ZOLOFT) 25 MG tablet TAKE 3 TABLETS BY MOUTH  ('75MG'$ ) EVERY MORNING Patient taking differently: Take 75 mg by mouth daily.  06/24/18  Yes Delight Hoh, MD  SUMAtriptan (IMITREX) 25 MG tablet Take 25 mg by mouth as needed for migraine. 07/17/17  Yes [provider]  acetaminophen (TYLENOL) 325 MG tablet Take 2 tablets (650 mg total) by mouth every 6 (six) hours as needed for mild pain. Patient not taking: Reported on 01/14/2018 04/21/13   Isaac Bliss, MD  cephALEXin (KEFLEX) 500 MG capsule Take 1 capsule (500 mg total) by mouth 2 (two) times daily. Patient not taking: Reported on 06/03/2018 01/15/18   Malvin Johns, MD  famotidine (PEPCID) 20 MG tablet Take 1 tablet (20 mg total) by mouth 2 (two) times daily. Patient not taking: Reported on 01/14/2018 07/11/15   Jean Rosenthal, NP  ibuprofen (ADVIL,MOTRIN) 600 MG tablet Take 1 tablet (600 mg total) by mouth every 6 (six) hours as needed. Patient not taking: Reported on 01/14/2018 09/19/15   Kristen Cardinal, NP  LORazepam (ATIVAN) 1 MG tablet Take 1 tablet (1 mg total) by mouth every 8 (eight) hours as needed for anxiety. Patient  not taking: Reported on 01/14/2018 07/11/15   Jean Rosenthal, NP  ondansetron (ZOFRAN) 4 MG tablet Take 1 tablet (4 mg total) by mouth every 6 (six) hours as needed for nausea. Patient not taking: Reported on 01/14/2018 09/19/15   Kristen Cardinal, NP  pantoprazole (PROTONIX) 40 MG tablet Take 1 tablet (40 mg total) by mouth 2 (two) times daily for 14 days. 08/11/19 08/25/19  Charlynne Cousins, MD    Scheduled Meds: . budesonide  0.25 mg Inhalation BID  . enoxaparin (LOVENOX) injection  40 mg Subcutaneous Daily  . pantoprazole  40 mg Oral BID  . sertraline  75 mg Oral Daily   Continuous Infusions: . sodium chloride 75 mL/hr at 08/12/19 1753   PRN Meds:.acetaminophen, ALPRAZolam, ketorolac, levalbuterol, morphine injection, ondansetron **OR** ondansetron (ZOFRAN) IV, prochlorperazine  Allergies as of 08/09/2019 - Review Complete 08/09/2019  Allergen Reaction Noted  . Augmentin [amoxicillin-pot clavulanate] Diarrhea 08/09/2019  . Morphine and related  08/09/2019    Family History  Problem Relation Age of Onset  . Anxiety disorder Mother   . Breast cancer Paternal Grandmother     Social History   Socioeconomic History  . Marital status: Single    Spouse name: Not on file  . Number of children: Not on file  . Years of education: Not on file  . Highest education level: Not on file  Occupational History  . Not on file  Tobacco Use  . Smoking status: Never Smoker  . Smokeless tobacco: Never Used  Substance and Sexual Activity  . Alcohol use: No  . Drug use: Not Currently  . Sexual activity: Not on file  Other Topics Concern  . Not on file  Social History Narrative  . Not on file   Social Determinants of Health   Financial Resource Strain:   . Difficulty of Paying Living Expenses:   Food Insecurity:   . Worried About Charity fundraiser in the Last Year:   . Arboriculturist in the Last Year:   Transportation Needs:   . Film/video editor (Medical):   Marland Kitchen Lack of  Transportation (Non-Medical):   Physical Activity:   . Days of Exercise per Week:   . Minutes of Exercise per Session:   Stress:   . Feeling of Stress :   Social Connections:   . Frequency of Communication with Friends and Family:   . Frequency of Social Gatherings with Friends and Family:   . Attends Religious Services:   . Active Member of Clubs or Organizations:   . Attends Archivist Meetings:   Marland Kitchen Marital Status:   Intimate Partner Violence:   . Fear of Current or Ex-Partner:   . Emotionally Abused:   Marland Kitchen Physically Abused:   . Sexually Abused:     Review of Systems: Review of Systems  Constitutional: Positive for weight loss (10 lb). Negative for chills and fever.  HENT: Negative for hearing loss and tinnitus.   Eyes: Negative for pain  and redness.  Respiratory: Negative for cough and shortness of breath.   Cardiovascular: Negative for chest pain and palpitations.  Gastrointestinal: Positive for abdominal pain, constipation, diarrhea, heartburn and nausea. Negative for blood in stool, melena and vomiting.  Genitourinary: Negative for flank pain and hematuria.  Musculoskeletal: Negative for back pain and joint pain.  Skin: Negative for itching and rash.  Neurological: Negative for seizures and loss of consciousness.  Endo/Heme/Allergies: Negative for polydipsia. Does not bruise/bleed easily.  Psychiatric/Behavioral: Negative for substance abuse. The patient is not nervous/anxious.    Physical Exam: Vital signs: Vitals:   08/13/19 0545 08/13/19 0800  BP: 99/64 104/73  Pulse: 97 88  Resp: 16 16  Temp: 98 F (36.7 C) (!) 97.5 F (36.4 C)  SpO2: 98% 99%   Last BM Date: 08/13/19 Physical Exam Constitutional:      General: She is not in acute distress.    Appearance: She is well-developed.     Comments: thin  HENT:     Head: Normocephalic and atraumatic.     Nose: Nose normal.     Mouth/Throat:     Mouth: Mucous membranes are moist.     Pharynx:  Oropharynx is clear.  Eyes:     General: No scleral icterus.    Extraocular Movements: Extraocular movements intact.     Conjunctiva/sclera: Conjunctivae normal.  Cardiovascular:     Rate and Rhythm: Normal rate and regular rhythm.     Pulses: Normal pulses.  Pulmonary:     Effort: Pulmonary effort is normal. No respiratory distress.     Breath sounds: Normal breath sounds.  Abdominal:     General: Bowel sounds are normal. There is no distension.     Palpations: Abdomen is soft. There is no mass.     Tenderness: There is abdominal tenderness (RLQ, LLQ but mostly localized to pelvis). There is guarding. There is no rebound.     Hernia: No hernia is present.  Musculoskeletal:        General: No swelling or tenderness.     Cervical back: Normal range of motion and neck supple.  Skin:    General: Skin is warm and dry.  Neurological:     General: No focal deficit present.     Mental Status: She is alert and oriented to person, place, and time.  Psychiatric:        Mood and Affect: Mood normal.        Behavior: Behavior normal.    GI:  Lab Results: Recent Labs    08/11/19 1747 08/13/19 0257  WBC 6.6 7.1  HGB 14.2 14.3  HCT 40.2 41.1  PLT 219 229   BMET Recent Labs    08/11/19 0628 08/12/19 1351 08/13/19 0257  NA 140 140 140  K 3.8 4.1 3.9  CL 109 109 109  CO2 '24 22 23  '$ GLUCOSE 97 89 90  BUN <5* 6 7  CREATININE 0.77 0.88 0.88  CALCIUM 8.3* 9.0 8.5*   LFT Recent Labs    08/13/19 0257  PROT 5.9*  ALBUMIN 3.5  AST 17  ALT 18  ALKPHOS 56  BILITOT 0.8   PT/INR No results for input(s): LABPROT, INR in the last 72 hours.   Studies/Results: CT ABDOMEN PELVIS W CONTRAST  Result Date: 08/11/2019 CLINICAL DATA:  Abdominal pain and cramping. EXAM: CT ABDOMEN AND PELVIS WITH CONTRAST TECHNIQUE: Multidetector CT imaging of the abdomen and pelvis was performed using the standard protocol following bolus administration of intravenous contrast. CONTRAST:  158m  OMNIPAQUE IOHEXOL 300 MG/ML  SOLN COMPARISON:  Abdominal CT and pelvic ultrasound 01/14/2018. FINDINGS: Lower chest: The lung bases are clear. Hepatobiliary: No focal liver abnormality is seen. No gallstones, gallbladder wall thickening, or biliary dilatation. Pancreas: No ductal dilatation or inflammation. Spleen: 5 mm hypodensity in the superior spleen is nonspecific but typically benign. No splenomegaly. Adrenals/Urinary Tract: Normal adrenal glands. No hydronephrosis or perinephric edema. Homogeneous renal enhancement. Urinary bladder is physiologically distended without wall thickening. Stomach/Bowel: Stomach is decompressed, unremarkable. Normal positioning of the duodenum and ligament of Treitz. There is no small bowel obstruction or inflammatory change. Administered enteric contrast reaches the distal colon. Appendix is air and contrast filled and normal. Moderate volume of stool throughout the colon. There is mild colonic tortuosity. No colonic wall thickening. Vascular/Lymphatic: Normal caliber abdominal aorta. Patent portal vein. Circumaortic left renal vein. Minimal reflux of contrast and dilatation of the gonadal vein, 5 mm on the left. No adenopathy. Reproductive: IUD appropriately position in the uterus which is unremarkable. Physiologic appearance of the ovaries. Slight increased left adnexal and periuterine vascularity. Other: Tiny fat containing umbilical hernia. No free air, free fluid, or intra-abdominal fluid collection. Musculoskeletal: There are no acute or suspicious osseous abnormalities. Hemi transitional lumbosacral anatomy. IMPRESSION: 1. No acute abnormality in the abdomen/pelvis. 2. Moderate colonic stool burden with colonic tortuosity, possible constipation. 3. Prominent left adnexal vascularity with minimal reflux contrast in dilatation of the ovarian vein can be seen with pelvic congestion syndrome in the appropriate clinical setting. Electronically Signed   By: MKeith RakeM.D.    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  However, due to weight loss, recommend outpatient follow up with Dr. OPaulita Fujitafor discussion of outpatient colonocsopy +/- EGD.    Trial of scheduled Bentyl today, followed by PRN dosing.  Discussed possible anticholinergic side effects with patient and patient's mom.  Continue Protonix 40 mg twice daily, continue as an outpatient until follow-up with Dr. OPaulita Fujita then consider decreasing to once daily dosing.  Miralax PRN to avoid constipation.  Today, pain is mostly in the pelvis.  Consider gynecologic evaluation for pelvic pain, increased vaginal discharge, and possible pelvic congestion syndrome per CT imaging.  [facility] will sign off.  Please contact [place] we can be of any further assistance during this hospital stay.   LOS: 1 day   ASalley Slaughter PA-C 08/13/2019, 11:16 AM  Contact #  [phone]

## 2019-08-13 NOTE — Progress Notes (Signed)
PROGRESS NOTE    Candace King  UVO:[id number] DOB: 03-12-2000 DOA: 08/09/2019 PCP: Judithann Sauger, MD     Brief Narrative:  Candace King is a 18 y.o. WF PMHx Asthma and depression, IBD (seen by Dr. Paulita Fujita GI)  Presents to the ER after patient became acutely nauseated with dizziness and short of breath after visiting church yesterday around 11 AM.  Patient states that she had taken the bread which usually is dipped in grape juice during the holy communion but looks like yesterday it was dipped in the wine and she had taken it for the first time.  5 minutes later patient became nauseated started feeling dizzy not feeling well and became short of breath.  At home she also had a temperature for which she took Advil.  Patient also took 1 dose of prochlorperazine for nausea at home.  Since patient symptoms persisted patient came to the ER.  ED Course: In the ER patient easily becomes tachycardic on movement.  EKG shows sinus tachycardia with QTC of 456 ms and QRS of 101 ms.  Labs show negative D-dimer with potassium of 3.1.  Anion gap is 18 bicarb is 14.  Patient states her oral medications including sertraline, as needed Xanax Qvar and albuterol are all has been taking for many years.  Denies taking any over-the-counter medications.  Patient was given IV Solu-Medrol for possible asthma exacerbation with nebulizer and also prochlorperazine for nausea.  Patient was started on fluids potassium replacement admitted for further observation given the persistent tachycardia.   Subjective: 7/8 A/O x4, negative CP, states continued abdominal pain but not as severe as on 7/7.   Assessment & Plan: Covid vaccination;   Principal Problem:   Tachycardia Active Problems:   Asthma   Depression   Abdominal pain   Pelvic congestion syndrome  Abdominal pain/constipation -7/7 SMOG enema + 1/2 bottle of magnesium citrate.  If after patient clears while still having discomfort will consult Dr. Paulita Fujita  GI.  Patient may require EGD/colonoscopy although very young for that procedure. 7/5 urine tox screen negative -TSH WNL -7/5 UDS negative -Normal saline 34ml/hr -7/8 consulted GI; recommended the following.  -7/8 Bentyl 10 mg TID  -7/8 Bentyl 10 mg QID PRN  -MiraLAX 1 container x1  -Follow-up as outpatient for EGD/colonoscopy  Pelvic Congestion syndrome  Discussed case with patient's OB/GYN Dr. Philis Pique at [facility] who reviewed CT scan was unimpressed (does not believe causing abdominal pain) but recommended obtaining pelvic ultrasound.  Pending  High anion gap metabolic acidosis -Resolved  Asthma -Currently not an issue  Depression/Anxiety -Xanax 1 mg PRN -Zoloft 75 mg daily  Migraine -Home regimen  Prochlorperazine    DVT prophylaxis: Lovenox Code Status: Full Family Communication: 7/8 mother present at bedside for discussion of plan of care Status is: Inpatient    Dispo: The patient is from: Home              Anticipated d/c is to: Home              Anticipated d/c date is: 7/10              Patient currently unstable     Consultants:  GI Dr. Wilford [facility] Dr.Horvath at [facility]   Procedures/Significant Events:  7/6 CT abdomen pelvis W contrast; negative acute abnormality -Moderate colonic stool burden possible constipation. -Prominent left adnexal vascularity with minimal reflux contrast in dilatation of the ovarian vein can be seen with pelvic congestion  syndrome in the appropriate clinical setting    I have personally reviewed and interpreted all radiology studies and my findings are as above.  VENTILATOR SETTINGS:    Cultures 7/4 SARS coronavirus negative 7/4 group A strep by PCR negative 7/5 HIV negative   Antimicrobials:    Devices    LINES / TUBES:      Continuous Infusions: . sodium chloride 75 mL/hr at 08/13/19 1454     Objective: Vitals:   08/12/19 2033 08/13/19 0545 08/13/19 0800  08/13/19 1259  BP: 109/80 99/64 104/73 109/79  Pulse: 97 97 88 64  Resp: 16 16 16 16   Temp: 97.9 F (36.6 C) 98 F (36.7 C) (!) 97.5 F (36.4 C) 97.9 F (36.6 C)  TempSrc: Oral Oral Oral Oral  SpO2: 96% 98% 99% 96%  Weight:      Height:       No intake or output data in the 24 hours ending 08/13/19 1829 Filed Weights   08/09/19 2106  Weight: 56.7 kg    Examination:  General: A/O x4, no acute respiratory distress, appears uncomfortable Eyes: negative scleral hemorrhage, negative anisocoria, negative icterus ENT: Negative Runny nose, negative gingival bleeding, Neck:  Negative scars, masses, torticollis, lymphadenopathy, JVD Lungs: Clear to auscultation bilaterally without wheezes or crackles Cardiovascular: Regular rate and rhythm without murmur gallop or rub normal S1 and S2 Abdomen: Positive generalized abdominal pain to palpation, nondistended, positive hypoactive, bowel sounds, no rebound, no ascites, no appreciable mass Extremities: No significant cyanosis, clubbing, or edema bilateral lower extremities Skin: Negative rashes, lesions, ulcers Psychiatric:  Negative depression, negative anxiety, negative fatigue, negative mania  Central nervous system:  Cranial nerves II through XII intact, tongue/uvula midline, all extremities muscle strength 5/5, sensation intact throughout, negative dysarthria, negative expressive aphasia, negative receptive aphasia.  .     Data Reviewed: Care during the described time interval was provided by me .  I have reviewed this patient's available data, including medical history, events of note, physical examination, and all test results as part of my evaluation.  CBC: Recent Labs  Lab 08/09/19 2108 08/10/19 0136 08/10/19 0500 08/11/19 1747 08/13/19 0257  WBC 6.6  --  2.8* 6.6 7.1  NEUTROABS 3.6  --  2.5 4.0 3.7  HGB 15.5* 12.2 12.3 14.2 14.3  HCT 44.7 36.0 37.1 40.2 41.1  MCV 92.5  --  96.1 93.1 93.4  PLT 259  --  171 219 025    Basic Metabolic Panel: Recent Labs  Lab 08/09/19 2108 08/09/19 2108 08/10/19 0136 08/10/19 0500 08/11/19 0628 08/12/19 1351 08/13/19 0257  NA 140   < > 144 141 140 140 140  K 3.1*   < > 2.9* 4.1 3.8 4.1 3.9  CL 108  --   --  116* 109 109 109  CO2 14*  --   --  19* 24 22 23   GLUCOSE 117*  --   --  151* 97 89 90  BUN 6  --   --  <5* <5* 6 7  CREATININE 0.91  --   --  0.54 0.77 0.88 0.88  CALCIUM 9.7  --   --  7.9* 8.3* 9.0 8.5*  MG  --   --   --  1.7  --  2.0 2.8*  PHOS  --   --   --   --   --  5.5* 4.9*   < > = values in this interval not displayed.   GFR: Estimated Creatinine Clearance: 88.8 mL/min (by  C-G formula based on SCr of 0.88 mg/dL). Liver Function Tests: Recent Labs  Lab 08/10/19 0500 08/12/19 1351 08/13/19 0257  AST 15 18 17   ALT 12 19 18   ALKPHOS 46 56 56  BILITOT 0.6 0.9 0.8  PROT 5.6* 6.5 5.9*  ALBUMIN 3.1* 3.8 3.5   No results for input(s): LIPASE, AMYLASE in the last 168 hours. No results for input(s): AMMONIA in the last 168 hours. Coagulation Profile: No results for input(s): INR, PROTIME in the last 168 hours. Cardiac Enzymes: Recent Labs  Lab 08/10/19 0500  CKTOTAL 54   BNP (last 3 results) No results for input(s): PROBNP in the last 8760 hours. HbA1C: No results for input(s): HGBA1C in the last 72 hours. CBG: No results for input(s): GLUCAP in the last 168 hours. Lipid Profile: No results for input(s): CHOL, HDL, LDLCALC, TRIG, CHOLHDL, LDLDIRECT in the last 72 hours. Thyroid Function Tests: No results for input(s): TSH, T4TOTAL, FREET4, T3FREE, THYROIDAB in the last 72 hours. Anemia Panel: No results for input(s): VITAMINB12, FOLATE, FERRITIN, TIBC, IRON, RETICCTPCT in the last 72 hours. Sepsis Labs: Recent Labs  Lab 08/10/19 8563  LATICACIDVEN 1.1    Recent Results (from the past 240 hour(s))  SARS Coronavirus 2 by RT PCR (hospital order, performed in [facility] hospital lab) Nasopharyngeal Throat     Status: None    Collection Time: 08/09/19  9:17 PM   Specimen: Throat; Nasopharyngeal  Result Value Ref Range Status   SARS Coronavirus 2 NEGATIVE NEGATIVE Final    Comment: (NOTE) SARS-CoV-2 target nucleic acids are NOT DETECTED.  The SARS-CoV-2 RNA is generally detectable in upper and lower respiratory specimens during the acute phase of infection. The lowest concentration of SARS-CoV-2 viral copies this assay can detect is 250 copies / mL. A negative result does not preclude SARS-CoV-2 infection and should not be used as the sole basis for treatment or other patient management decisions.  A negative result may occur with improper specimen collection / handling, submission of specimen other than nasopharyngeal swab, presence of viral mutation(s) within the areas targeted by this assay, and inadequate number of viral copies (<250 copies / mL). A negative result must be combined with clinical observations, patient history, and epidemiological information.  Fact Sheet for Patients:   StrictlyIdeas.no  Fact Sheet for Healthcare Providers: BankingDealers.co.za  This test is not yet approved or  cleared by the [place] FDA and has been authorized for detection and/or diagnosis of SARS-CoV-2 by FDA under an Emergency Use Authorization (EUA).  This EUA will remain in effect (meaning this test can be used) for the duration of the COVID-19 declaration under Section 564(b)(1) of the Act, 21 U.S.C. section 360bbb-3(b)(1), unless the authorization is terminated or revoked sooner.  Performed at [facility], [place] [address], [place], [place] [address]   Group A Strep by PCR     Status: None   Collection Time: 08/09/19  9:17 PM   Specimen: Throat; Sterile Swab  Result Value Ref Range Status   Group A Strep by PCR NOT DETECTED NOT DETECTED Final    Comment: Performed at [facility], [place] [address] [place], [place] [address]          Radiology Studies: CT ABDOMEN PELVIS W CONTRAST  Result Date: 08/11/2019 CLINICAL DATA:  Abdominal pain and cramping. EXAM: CT ABDOMEN AND PELVIS WITH CONTRAST TECHNIQUE: Multidetector CT imaging of the abdomen and pelvis was performed using the standard protocol following bolus administration of intravenous  contrast. CONTRAST:  130mL OMNIPAQUE IOHEXOL 300 MG/ML  SOLN COMPARISON:  Abdominal CT and pelvic ultrasound 01/14/2018. FINDINGS: Lower chest: The lung bases are clear. Hepatobiliary: No focal liver abnormality is seen. No gallstones, gallbladder wall thickening, or biliary dilatation. Pancreas: No ductal dilatation or inflammation. Spleen: 5 mm hypodensity in the superior spleen is nonspecific but typically benign. No splenomegaly. Adrenals/Urinary Tract: Normal adrenal glands. No hydronephrosis or perinephric edema. Homogeneous renal enhancement. Urinary bladder is physiologically distended without wall thickening. Stomach/Bowel: Stomach is decompressed, unremarkable. Normal positioning of the duodenum and ligament of Treitz. There is no small bowel obstruction or inflammatory change. Administered enteric contrast reaches the distal colon. Appendix is air and contrast filled and normal. Moderate volume of stool throughout the colon. There is mild colonic tortuosity. No colonic wall thickening. Vascular/Lymphatic: Normal caliber abdominal aorta. Patent portal vein. Circumaortic left renal vein. Minimal reflux of contrast and dilatation of the gonadal vein, 5 mm on the left. No adenopathy. Reproductive: IUD appropriately position in the uterus which is unremarkable. Physiologic appearance of the ovaries. Slight increased left adnexal and periuterine vascularity. Other: Tiny fat containing umbilical hernia. No free air, free fluid, or intra-abdominal fluid collection. Musculoskeletal: There are no acute or suspicious osseous abnormalities. Hemi transitional lumbosacral anatomy. IMPRESSION: 1. No  acute abnormality in the abdomen/pelvis. 2. Moderate colonic stool burden with colonic tortuosity, possible constipation. 3. Prominent left adnexal vascularity with minimal reflux contrast in dilatation of the ovarian vein can be seen with pelvic congestion syndrome in the appropriate clinical setting. Electronically Signed   By: Keith Rake M.D.   On: 08/11/2019 23:52   DG ABD ACUTE 2+V W 1V CHEST  Result Date: 08/13/2019 CLINICAL DATA:  Pain with constipation EXAM: DG ABDOMEN ACUTE W/ 1V CHEST COMPARISON:  CT abdomen and pelvis August 11, 2019; chest radiograph August 09, 2019 FINDINGS: PA chest: Lungs are clear. Heart size and pulmonary vascularity are normal. No adenopathy. Supine and upright abdomen: There is moderate stool in the colon. There is no bowel dilatation or air-fluid level to suggest bowel obstruction. No free air. Intrauterine device in mid pelvis. IMPRESSION: No bowel obstruction or free air. Intrauterine device in mid pelvis. Lungs clear. Electronically Signed   By: Lowella Grip III M.D.   On: 08/13/2019 14:58        Scheduled Meds: . budesonide  0.25 mg Inhalation BID  . dicyclomine  10 mg Oral TID AC & HS  . enoxaparin (LOVENOX) injection  40 mg Subcutaneous Daily  . pantoprazole  40 mg Oral BID  . sertraline  75 mg Oral Daily   Continuous Infusions: . sodium chloride 75 mL/hr at 08/13/19 1454     LOS: 1 day    Time spent:40 min    Nataliee Shurtz, Geraldo Docker, MD [facility] Pager [phone]  If 7PM-7AM, please contact night-coverage [email or web address] Password [facility] 08/13/2019, 6:29 PM

## 2019-08-14 ENCOUNTER — Inpatient Hospital Stay (HOSPITAL_COMMUNITY): Payer: 59

## 2019-08-14 DIAGNOSIS — K6389 Other specified diseases of intestine: Secondary | ICD-10-CM

## 2019-08-14 LAB — COMPREHENSIVE METABOLIC PANEL
ALT: 18 U/L (ref 0–44)
AST: 15 U/L (ref 15–41)
Albumin: 2.9 g/dL — ABNORMAL LOW (ref 3.5–5.0)
Alkaline Phosphatase: 45 U/L (ref 38–126)
Anion gap: 6 (ref 5–15)
BUN: <5 mg/dL — ABNORMAL LOW (ref 6–20)
CO2: 20 mmol/L — ABNORMAL LOW (ref 22–32)
Calcium: 8.3 mg/dL — ABNORMAL LOW (ref 8.9–10.3)
Chloride: 112 mmol/L — ABNORMAL HIGH (ref 98–111)
Creatinine, Ser: 0.62 mg/dL (ref 0.44–1.00)
GFR calc Af Amer: >60 mL/min (ref >60)
GFR calc non Af Amer: >60 mL/min (ref >60)
Glucose, Bld: 90 mg/dL (ref 70–99)
Potassium: 3.9 mmol/L (ref 3.5–5.1)
Sodium: 138 mmol/L (ref 135–145)
Total Bilirubin: 0.5 mg/dL (ref 0.3–1.2)
Total Protein: 4.9 g/dL — ABNORMAL LOW (ref 6.5–8.1)

## 2019-08-14 LAB — CBC WITH DIFFERENTIAL/PLATELET
Abs Immature Granulocytes: 0.01 10*3/uL (ref 0.00–0.07)
Basophils Absolute: 0 10*3/uL (ref 0.0–0.1)
Basophils Relative: 1 %
Eosinophils Absolute: 0.2 10*3/uL (ref 0.0–0.5)
Eosinophils Relative: 3 %
HCT: 35.3 % — ABNORMAL LOW (ref 36.0–46.0)
Hemoglobin: 12.1 g/dL (ref 12.0–15.0)
Immature Granulocytes: 0 %
Lymphocytes Relative: 40 %
Lymphs Abs: 2.4 10*3/uL (ref 0.7–4.0)
MCH: 32.1 pg (ref 26.0–34.0)
MCHC: 34.3 g/dL (ref 30.0–36.0)
MCV: 93.6 fL (ref 80.0–100.0)
Monocytes Absolute: 0.5 10*3/uL (ref 0.1–1.0)
Monocytes Relative: 9 %
Neutro Abs: 2.9 10*3/uL (ref 1.7–7.7)
Neutrophils Relative %: 47 %
Platelets: 174 10*3/uL (ref 150–400)
RBC: 3.77 MIL/uL — ABNORMAL LOW (ref 3.87–5.11)
RDW: 11.9 % (ref 11.5–15.5)
WBC: 6 10*3/uL (ref 4.0–10.5)
nRBC: 0 % (ref 0.0–0.2)

## 2019-08-14 LAB — SEDIMENTATION RATE: Sed Rate: 4 mm/hr (ref 0–22)

## 2019-08-14 LAB — MAGNESIUM: Magnesium: 2 mg/dL (ref 1.7–2.4)

## 2019-08-14 LAB — C-REACTIVE PROTEIN: CRP: 0.5 mg/dL (ref <1.0)

## 2019-08-14 LAB — PHOSPHORUS: Phosphorus: 4.5 mg/dL (ref 2.5–4.6)

## 2019-08-14 MED ORDER — IOHEXOL 300 MG/ML  SOLN
100.0000 mL | Freq: Once | INTRAMUSCULAR | Status: AC | PRN
  Administered 2019-08-14: 100 mL via INTRAVENOUS

## 2019-08-14 MED ORDER — HALOPERIDOL LACTATE 5 MG/ML IJ SOLN
0.5000 mg | Freq: Two times a day (BID) | INTRAMUSCULAR | Status: DC
  Administered 2019-08-14: 0.5 mg via INTRAVENOUS
  Filled 2019-08-14 (×4): qty 1

## 2019-08-14 NOTE — Evaluation (Addendum)
Occupational Therapy Evaluation Patient Details Name: Candace King MRN: [id number] DOB: Apr 10, 2000 Today's Date: 08/14/2019    History of Present Illness Pt is a 19 y.o. female presenting with SOB x2 days w/ sore throat and fever. Pt. found to be tachycardic in ED. PMHx significant for asthma, PE, telangiectasia, and depression.    Clinical Impression   PTA, patient was independent with all BADLs/IADLs and was living with her mother in a 2-level private residence with 3 STE and bilateral rails from garage entry. During evaluation, patient reporting 10/10 pain at rest in RLQ and above perineal area with inability to progress beyond bed level. Patient shouting in pain. RN notified. Patient given time to recover after bout of sharp pain. Upon this OT's return to room, patient progressed to EOB with PT but ambulation deferred 2/2 GI staff in room. Per PT and patient report, patient able to get up to [facility] and ambulate in hallway with supervision A for safety without use of DME yesterday. Anticipated ability to complete self-care tasks with grossly supervision to Min guard assist without use of AE/DME. Patient does not currently require continued acute occupational therapy services with OT to sign off at this time. Recommendation for return home with initial 24 hour supervision/assist from mother who is currently home during the day and able to assist as needed. Mother states that patient is able to live on main level upon d/c home with access to full bathroom with walk-in shower and built-in shower seat. Patient does not currently demonstrate need for follow-up OT. Please refer to PT note for additional recommendations.     Follow Up Recommendations  No OT follow up;Supervision/Assistance - 24 hour    Equipment Recommendations  None recommended by OT    Recommendations for Other Services       Precautions / Restrictions Precautions Precautions: None Restrictions Weight Bearing Restrictions: No       Mobility Bed Mobility Overal bed mobility: Needs Assistance Bed Mobility: Supine to Sit;Sit to Supine     Supine to sit: Supervision Sit to supine: Supervision   General bed mobility comments: no physical assistance needed; attempted to educate pt on log roll for comfort but pt preferring to sit straight up in bed and swing LEs off of bed  Transfers                 General transfer comment: deferred as GI MD and PA entering room to perform an exam of pt's abdomen    Balance Overall balance assessment: Needs assistance Sitting-balance support: Feet supported Sitting balance-Leahy Scale: Good                                     ADL either performed or assessed with clinical judgement   ADL Overall ADL's : Needs assistance/impaired                         Toilet Transfer: Copy Details (indicate cue type and reason): Per PT report, patient seated on BSC yesterday. Pt. reports ambulating in hallway without use of AD and supervision A only.                  Vision         Perception     Praxis      Pertinent Vitals/Pain Pain Assessment: Faces Pain Score: 10-Worst pain ever Faces Pain Scale:  No hurt Pain Location: RLQ of abdomen Pain Descriptors / Indicators: Sharp;Crying Pain Intervention(s): Premedicated before session     Hand Dominance Right   Extremity/Trunk Assessment Upper Extremity Assessment Upper Extremity Assessment: Defer to OT evaluation;Overall [facility] for tasks assessed   Lower Extremity Assessment Lower Extremity Assessment: Overall WFL for tasks assessed   Cervical / Trunk Assessment Cervical / Trunk Assessment: Normal   Communication Communication Communication: No difficulties   Cognition Arousal/Alertness: Lethargic;Suspect due to medications Behavior During Therapy: Flat affect Overall Cognitive Status: Within Functional Limits for tasks assessed                                      General Comments  Mother reports that her and pt. father are recently divorced as of March. Pt. mother also reports that pt.  grandfather passed away from [place] in 04/17/22.     Exercises     Shoulder Instructions      Home Living Family/patient expects to be discharged to:: Private residence Living Arrangements: Parent Available Help at Discharge: Available 24 hours/day Type of Home: House Home Access: Stairs to enter Technical brewer of Steps: 3 Entrance Stairs-Rails: Right;Left;Can reach both Home Layout: Two level Alternate Level Stairs-Number of Steps: 8-10 Alternate Level Stairs-Rails: Right;Left (Cannot reach both) Bathroom Shower/Tub: Occupational psychologist: Standard     Home Equipment: Civil engineer, contracting - built in;Hand held shower head          Prior Functioning/Environment Level of Independence: Independent                 OT Problem List:        OT Treatment/Interventions:      OT Goals(Current goals can be found in the care plan section) Acute Rehab OT Goals Patient Stated Goal: decrease pain  OT Frequency:     Barriers to D/C:            Co-evaluation   Reason for Co-Treatment: Necessary to address cognition/behavior during functional activity PT goals addressed during session: Mobility/safety with mobility;Balance;Strengthening/ROM        AM-PAC OT "6 Clicks" Daily Activity     Outcome Measure Help from another person eating meals?: None Help from another person taking care of personal grooming?: None Help from another person toileting, which includes using toliet, bedpan, or urinal?: A Little Help from another person bathing (including washing, rinsing, drying)?: A Little Help from another person to put on and taking off regular upper body clothing?: None Help from another person to put on and taking off regular lower body clothing?: None 6 Click Score: 22   End of Session    Activity  Tolerance:   Patient left:                     Time: 4656-8127 OT Time Calculation (min): 34 min Charges:  OT General Charges $OT Visit: 1 Visit OT Evaluation $OT Eval Low Complexity: 1 Low OT Treatments $Self Care/Home Management : 8-22 mins  Candace King H. OTR/L Supplemental OT, Department of rehab services [phone]  Candace Justiss R H. 08/14/2019, 5:27 PM

## 2019-08-14 NOTE — Progress Notes (Signed)
[facility] Gastroenterology Progress Note  Candace King 19 y.o. 2000-06-25  CC:  Abdominal pain  Subjective: Patient reports continued abdominal pain mostly in the right lower quadrant and radiating to the right pelvis.  She states movement exacerbates the pain.  She states trying to urinate also exacerbates the pain.  She states the pain is currently a 9/10 but was previously a 15/10.  She feels as if her pain is being underestimated.  She states the Bentyl provided a little relief but did not completely resolve her cramping.  Abdominal x-rays yesterday showed moderate stool burden, so MiraLAX 255g was given for constipation, and she reports a little bit of improvement after having some bowel movements.  However, she reports "uncontrollable diarrhea."  She states that the diarrhea caused some irritation in the rectum.  She denies any rectal bleeding or melena.  She continues to have nausea but no vomiting.  She has not been eating much, as she states food makes her feel more nauseated.     ROS : Review of Systems  Cardiovascular: Negative for chest pain and palpitations.  Gastrointestinal: Positive for abdominal pain, diarrhea and nausea. Negative for blood in stool, constipation, heartburn, melena and vomiting.   Objective: Vital signs in last 24 hours: Vitals:   08/14/19 0758 08/14/19 1000  BP:  106/77  Pulse:  94  Resp:    Temp:  97.9 F (36.6 C)  SpO2: 99% 99%    Physical Exam:  General:  Alert, oriented, cooperative, mother at bedside  Head:  Normocephalic, without obvious abnormality, atraumatic  Eyes:   Anicteric sclera, EOMs intact  Lungs:   Clear to auscultation bilaterally, respirations unlabored  Heart:  Regular rate and rhythm, S1, S2 normal  Abdomen:   Soft and nondistended, but the patient reports severe abdominal pain with light palpation.  I shook the bed a little bit, and patient started crying, grabbed her lower abdomen, and reported severe pain.  Bowel sounds active  all four quadrants.  Extremities: Extremities normal, atraumatic, no  edema  Pulses: 2+ and symmetric    Lab Results: Recent Labs    08/13/19 0257 08/14/19 0759  NA 140 138  K 3.9 3.9  CL 109 112*  CO2 23 20*  GLUCOSE 90 90  BUN 7 <5*  CREATININE 0.88 0.62  CALCIUM 8.5* 8.3*  MG 2.8* 2.0  PHOS 4.9* 4.5   Recent Labs    08/13/19 0257 08/14/19 0759  AST 17 15  ALT 18 18  ALKPHOS 56 45  BILITOT 0.8 0.5  PROT 5.9* 4.9*  ALBUMIN 3.5 2.9*   Recent Labs    08/13/19 0257 08/14/19 0759  WBC 7.1 6.0  NEUTROABS 3.7 2.9  HGB 14.3 12.1  HCT 41.1 35.3*  MCV 93.4 93.6  PLT 229 174   No results for input(s): LABPROT, INR in the last 72 hours.  Impression: Acute on chronic abdominal pain -Possibly related to IBS; moderate colonic stool burden and colonic tortuosity noted on CT 7/6.  Patient continues to report pain despite resolution of constipation after bowel prep. 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  However, due to weight loss, recommend outpatient follow up with Dr. Paulita Fujita for discussion of outpatient colonocsopy +/- EGD.    Continue Protonix 40 mg twice daily, continue as an outpatient until follow-up with Dr. Paulita Fujita, then consider decreasing to once daily dosing.  Due to patient report of severe pain, we will repeat CT today to rule out any acute process.  Patient symptoms are not compatible with  IBD, and CT did not show any intestinal inflammation.  CRP/ESR were ordered and were negative.  At this time, we do not believe an inpatient EGD/colonoscopy is warranted.   Salley Slaughter PA-C 08/14/2019, 2:35 PM  Contact #  [phone]

## 2019-08-14 NOTE — Progress Notes (Signed)
Attempted to call to schedule appointment (s) for patient. OBGYN office's secretary/office representative  prefers for patient or mother to call and set that up. Dr. Paulita Fujita will give pt's mother a call regarding her GI appoint,emt.

## 2019-08-14 NOTE — Progress Notes (Signed)
Patient and family request for chaplain and financial help. Rn consult chaplain and SW to call financial assistance staff to speak with them.

## 2019-08-14 NOTE — Evaluation (Signed)
Physical Therapy Evaluation Patient Details Name: Candace King MRN: [id number] DOB: [birth year] Today's Date: 08/14/2019   History of Present Illness  Pt is a 19 y.o. female presenting with SOB x2 days w/ sore throat and fever. Pt. found to be tachycardic in ED. PMHx significant for asthma, telangiectasia, and depression.     Clinical Impression  Pt presented supine in bed with HOB elevated, awake and willing to participate in therapy session. Prior to admission, pt was independent with all functional mobility and ADLs. Pt lives with her mother in a two level house with three steps to enter. At the time of evaluation, pt limited overall secondary to fatigue, weakness and lethargy (likely due to recent administration of Haldol per RN). She was able to achieve sitting upright at EOB with supervision, increased time and effort needed. She was able to sit EOB without UE supports with supervision without difficulties. Pt's HR fluctuating between 95-107 bpm throughout session. Gastroenterology MD and PA entering room and requesting pt lie back down in bed for a physical exam of pt's abdomen. Afterwards, pt more lethargic and unable to further participate in evaluation. Once pt is more alert, anticipate that she will likely progress to transfers and gait training. Pt reporting that she ambulated in hallway on 7/7 with staff present for safety without use of an AD. Based on pt's current functional mobility status and 24/7 support from mother at home, would recommend pt d/c home with f/u HHPT services to further address her deficits. Will continue to f/u with pt acutely to progress mobility as tolerated. Pt would continue to benefit from skilled physical therapy services at this time while admitted and after d/c to address the below listed limitations in order to improve overall safety and independence with functional mobility.     Follow Up Recommendations Home health PT;Supervision/Assistance - 24 hour     Equipment Recommendations  3in1 (PT);Other (comment) (TBD based on mobility progression)    Recommendations for Other Services       Precautions / Restrictions Precautions Precautions: None Restrictions Weight Bearing Restrictions: No      Mobility  Bed Mobility Overal bed mobility: Needs Assistance Bed Mobility: Supine to Sit;Sit to Supine     Supine to sit: Supervision Sit to supine: Supervision   General bed mobility comments: no physical assistance needed; attempted to educate pt on log roll for comfort but pt preferring to sit straight up in bed and swing LEs off of bed  Transfers                 General transfer comment: deferred as GI MD and PA entering room to perform an exam of pt's abdomen  Ambulation/Gait                Stairs            Wheelchair Mobility    Modified Rankin (Stroke Patients Only)       Balance Overall balance assessment: Needs assistance Sitting-balance support: Feet supported Sitting balance-Leahy Scale: Good                                       Pertinent Vitals/Pain Pain Assessment: Faces Pain Score: 10-Worst pain ever Faces Pain Scale: No hurt Pain Location: RLQ of abdomen Pain Descriptors / Indicators: Sharp;Crying Pain Intervention(s): Premedicated before session    Home Living Family/patient expects to be discharged to:: Private residence  Living Arrangements: Parent Available Help at Discharge: Available 24 hours/day Type of Home: House Home Access: Stairs to enter Entrance Stairs-Rails: Right;Left;Can reach both Entrance Stairs-Number of Steps: 3 Home Layout: Two level Home Equipment: Shower seat - built in;Hand held shower head      Prior Function Level of Independence: Independent               Hand Dominance   Dominant Hand: Right    Extremity/Trunk Assessment   Upper Extremity Assessment Upper Extremity Assessment: Defer to OT evaluation;Overall [facility] for  tasks assessed    Lower Extremity Assessment Lower Extremity Assessment: Overall WFL for tasks assessed    Cervical / Trunk Assessment Cervical / Trunk Assessment: Normal  Communication   Communication: No difficulties  Cognition Arousal/Alertness: Lethargic;Suspect due to medications Behavior During Therapy: Flat affect Overall Cognitive Status: Within Functional Limits for tasks assessed                                        General Comments General comments (skin integrity, edema, etc.): Mother reports that her and pt. father are recently divorced as of March. Pt mother also reports that her father passed away from [place] in 2022/04/06.     Exercises     Assessment/Plan    PT Assessment Patient needs continued PT services  PT Problem List Decreased strength;Decreased mobility;Decreased coordination;Decreased knowledge of use of DME;Decreased safety awareness;Decreased knowledge of precautions       PT Treatment Interventions DME instruction;Gait training;Stair training;Functional mobility training;Therapeutic activities;Therapeutic exercise;Balance training;Neuromuscular re-education;Patient/family education    PT Goals (Current goals can be found in the Care Plan section)  Acute Rehab PT Goals Patient Stated Goal: decrease pain PT Goal Formulation: With patient/family Time For Goal Achievement: 08/28/19 Potential to Achieve Goals: Good    Frequency Min 3X/week   Barriers to discharge        Co-evaluation PT/OT/SLP Co-Evaluation/Treatment: Yes Reason for Co-Treatment: Necessary to address cognition/behavior during functional activity PT goals addressed during session: Mobility/safety with mobility;Balance;Strengthening/ROM         AM-PAC PT "6 Clicks" Mobility  Outcome Measure Help needed turning from your back to your side while in a flat bed without using bedrails?: None Help needed moving from lying on your back to sitting on the side of a  flat bed without using bedrails?: None Help needed moving to and from a bed to a chair (including a wheelchair)?: A Little Help needed standing up from a chair using your arms (e.g., wheelchair or bedside chair)?: A Little Help needed to walk in hospital room?: A Little Help needed climbing 3-5 steps with a railing? : A Little 6 Click Score: 20    End of Session   Activity Tolerance: Patient limited by fatigue;Patient limited by lethargy Patient left: in bed;with call bell/phone within reach;with family/visitor present Nurse Communication: Mobility status PT Visit Diagnosis: Other abnormalities of gait and mobility (R26.89)    Time: 6644-0347 PT Time Calculation (min) (ACUTE ONLY): 24 min   Charges:   PT Evaluation $PT Eval Moderate Complexity: 1 Mod          Eduard Clos, PT, DPT  Acute Rehabilitation Services Pager [phone] Office [place] 08/14/2019, 4:06 PM

## 2019-08-14 NOTE — Progress Notes (Signed)
The chaplain followed-up after receiving a consult. The patient and family expressed displeasure with the quality of care they received stating they did not feel cared for and wanted nurses and physicians to be more compassionate. The chaplain offered support and prayer.  Brion Aliment Chaplain Resident For questions concerning this note please contact me by pager [phone]

## 2019-08-14 NOTE — Progress Notes (Addendum)
PROGRESS NOTE    Candace King  CXK:[id number] DOB: 05-Oct-2000 DOA: 08/09/2019 PCP: Judithann Sauger, MD     Brief Narrative:  Candace King is a 19 y.o. WF PMHx Asthma and depression, IBD (seen by Dr. Paulita Fujita GI)  Presents to the ER after patient became acutely nauseated with dizziness and short of breath after visiting church yesterday around 11 AM.  Patient states that she had taken the bread which usually is dipped in grape juice during the holy communion but looks like yesterday it was dipped in the wine and she had taken it for the first time.  5 minutes later patient became nauseated started feeling dizzy not feeling well and became short of breath.  At home she also had a temperature for which she took Advil.  Patient also took 1 dose of prochlorperazine for nausea at home.  Since patient symptoms persisted patient came to the ER.  ED Course: In the ER patient easily becomes tachycardic on movement.  EKG shows sinus tachycardia with QTC of 456 ms and QRS of 101 ms.  Labs show negative D-dimer with potassium of 3.1.  Anion gap is 18 bicarb is 14.  Patient states her oral medications including sertraline, as needed Xanax Qvar and albuterol are all has been taking for many years.  Denies taking any over-the-counter medications.  Patient was given IV Solu-Medrol for possible asthma exacerbation with nebulizer and also prochlorperazine for nausea.  Patient was started on fluids potassium replacement admitted for further observation given the persistent tachycardia.   Subjective: 7/9 A/O x4, negative CP. Positive continued abdominal pain>>> RLQ. Patient very histrionic, 1 minute will be sitting quietly and speaking with you in the next will be screaming and crying. This usually occurs when mother is present, which appears to aggravate abdominal pain. Rounded on patient at least 3 times today totaling approximately 3 hours of work. Multidisciplinary rounds with chaplain, and  GI.   Assessment & Plan: Covid vaccination;   Principal Problem:   Tachycardia Active Problems:   Asthma   Depression   Abdominal pain   Pelvic congestion syndrome   Epiploic appendagitis  Abdominal pain/constipation -7/7 SMOG enema + 1/2 bottle of magnesium citrate.  If after patient clears while still having discomfort will consult Dr. Paulita Fujita GI.  Patient may require EGD/colonoscopy although very young for that procedure. 7/5 urine tox screen negative -TSH WNL -7/5 UDS negative -Normal saline 71ml/hr -7/8 consulted GI; recommended the following.  -7/8 Bentyl 10 mg TID  -7/8 Bentyl 10 mg QID PRN  -MiraLAX 1 container x1  -Follow-up as outpatient for EGD/colonoscopy -7/9 multidisciplinary conference held between GI, myself, and RN Havy. Again GI reiterated pain most likely due to irritable bowel syndrome exacerbated by constipation. Colonoscopy currently not indicated. If repeat abdominal/pelvis CT negative discharge patient home. (See [facility] note 7/9) -7/9 schedule follow-up 2 weeks with Dr. Arta Silence GI possible EGD/colonoscopy as an outpatient, IBS mixed type -RN Ave Filter called [facility] attempted to schedule appointment with Dr. Arta Silence, however office stated they would rather call patient directly to schedule appointment. This information was communicated to mother and patient  Pelvic Congestion syndrome  Discussed case with patient's OB/GYN Dr. Philis Pique at [place] who reviewed CT scan was unimpressed (does not believe causing abdominal pain) but recommended obtaining pelvic ultrasound.  Pending -Transvaginal ultrasound unremarkable see results below -Schedule follow-up appointment in 1 to 2 weeks with Dr. Jerelyn Charles at [facility] called [facility] and attempted to obtain follow-up appointment with Dr. Carlis Abbott, office stated they would rather call patient directly to schedule follow-up appointment. This information was  communicated to mother and patient  Epiploic appendagitis? -7/9 repeat CT scan shows possible epiploic appendagitis or omental infarct see results below -Normally managed conservatively; p.o.  anti-inflammatory medications, and if needed a short course of opioids  for four to seven days Usually do not require hospitalization or antibiotics  -Most severe cases in patients whose symptoms fail to improve with conservative management, those with new or worsening symptoms surgical ligation and resection of inflamed appendage  -7/10 will consult surgery and have them make recommendations.  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 Patient has a poor relationship with her father. In addition under stress from mother getting divorced from her father. -7/9 consult to psychiatry.  Migraine -Home regimen  Prochlorperazine     Goals of care -7/9 PT/OT consult ;patient has no family in town, considerable weakness, abdominal pain.  Will be unable to care for herself evaluate and place recommendation for SNF STAT. PT/OT recommend home health with supervision/assistance 24 hours    DVT prophylaxis: Lovenox Code Status: Full Family Communication: 7/9 mother present at bedside for discussion of plan of care. Extremely disruptive influence. Consistently interrupts physician or RN when they are trying to explain plan of care to patient or illicit questions from patient concerning her understanding of plan of care. Mother was informed today by myself that patient is a competent adult and therefore we needed to ensure that patient understood plan of care and answered all of her questions. Mother was very welcome to listen and ask pertinent questions.  Status is:  Inpatient    Dispo: The patient is from: Home              Anticipated d/c is to: Home              Anticipated d/c date is: 7/10              Patient currently unstable     Consultants:  GI Dr. Wilford Corner OB/[facility] Dr.Horvath at [facility]   Procedures/Significant Events:  7/6 CT abdomen pelvis W contrast; negative acute abnormality -Moderate colonic stool burden possible constipation. -Prominent left adnexal vascularity with minimal reflux contrast in dilatation of the ovarian vein can be seen with pelvic congestion syndrome in the appropriate clinical setting 7/8 US pelvic complete with transvaginal; remarkable pelvic ultrasound-IUD in normal position 7/9 CT abdomen pelvis W contrast;-normal appendix. -Minimal edema in the pericolonic fat adjacent to the ascending colon, not seen on prior exam. In the setting of right-sided pain this may represent epiploic appendagitis or omental infarct. -Similar findings second be seen with pelvic congestion syndrome from recent exam.   I have personally reviewed and interpreted all radiology studies and my findings are as above.  VENTILATOR SETTINGS:    Cultures 7/4 SARS coronavirus negative 7/4 group A strep by PCR negative 7/5 HIV negative   Antimicrobials:    Devices    LINES / TUBES:      Continuous Infusions: . sodium chloride 75 mL/hr at 08/14/19 0426     Objective: Vitals:   08/14/19 0758 08/14/19 1000 08/14/19 1600 08/14/19 2017  BP:  106/77  102/64   Pulse:  94 80   Resp:   18   Temp:  97.9 F (36.6 C) (!) 97.3 F (36.3 C)   TempSrc:  Oral Oral   SpO2: 99% 99% 99% 99%  Weight:      Height:        Intake/Output Summary (Last 24 hours) at 08/14/2019 2102 Last data filed at 08/14/2019 1900 Gross per 24 hour  Intake 3529.11 ml  Output --  Net 3529.11 ml   Filed Weights   08/09/19 2106  Weight: 56.7 kg   Physical Exam:  General: A/O x4, no acute respiratory distress, waxing and  waning episodes of comfort Eyes: negative scleral hemorrhage, negative anisocoria, negative icterus ENT: Negative Runny nose, negative gingival bleeding, Neck:  Negative scars, masses, torticollis, lymphadenopathy, JVD Lungs: Clear to auscultation bilaterally without wheezes or crackles Cardiovascular: Regular rate and rhythm without murmur gallop or rub normal S1 and S2 Abdomen: Positive abdominal pain to palpation>>RLQ,, nondistended, positive soft, bowel sounds, no rebound, no ascites, no appreciable mass Extremities: No significant cyanosis, clubbing, or edema bilateral lower extremities Skin: Negative rashes, lesions, ulcers Psychiatric:  Positive depression, Positive anxiety, negative fatigue, negative mania  Central nervous system:  Cranial nerves II through XII intact, tongue/uvula midline, all extremities muscle strength 5/5, sensation intact throughout,  negative dysarthria, negative expressive aphasia, negative receptive aphasia.  .     Data Reviewed: Care during the described time interval was provided by me .  I have reviewed this patient's available data, including medical history, events of note, physical examination, and all test results as part of my evaluation.  CBC: Recent Labs  Lab 08/09/19 2108 08/09/19 2108 08/10/19 0136 08/10/19 0500 08/11/19 1747 08/13/19 0257 08/14/19 0759  WBC 6.6  --   --  2.8* 6.6 7.1 6.0  NEUTROABS 3.6  --   --  2.5 4.0 3.7 2.9  HGB 15.5*   < > 12.2 12.3 14.2 14.3 12.1  HCT 44.7   < > 36.0 37.1 40.2 41.1 35.3*  MCV 92.5  --   --  96.1 93.1 93.4 93.6  PLT 259  --   --  171 219 229 174   < > = values in this interval not displayed.   Basic Metabolic Panel: Recent Labs  Lab 08/10/19 0500 08/11/19 0628 08/12/19 1351 08/13/19 0257 08/14/19 0759  NA 141 140 140 140 138  K 4.1 3.8 4.1 3.9 3.9  CL 116* 109 109 109 112*  CO2 19* 24 22 23  20*  GLUCOSE 151* 97 89 90 90  BUN <5* <5* 6 7 <5*  CREATININE 0.54 0.77 0.88 0.88 0.62   CALCIUM 7.9* 8.3* 9.0 8.5* 8.3*  MG 1.7  --  2.0 2.8* 2.0  PHOS  --   --  5.5* 4.9* 4.5   GFR: Estimated Creatinine Clearance: 97.7 mL/min (by C-G formula based on SCr of 0.62 mg/dL). Liver Function Tests: Recent Labs  Lab 08/10/19 0500 08/12/19 1351 08/13/19 0257 08/14/19 0759  AST 15 18 17 15   ALT 12 19 18 18   ALKPHOS 46 56 56 45  BILITOT 0.6 0.9 0.8 0.5  PROT 5.6* 6.5 5.9* 4.9*  ALBUMIN 3.1* 3.8 3.5 2.9*   No results for input(s): LIPASE, AMYLASE in the last 168 hours. No results for input(s): AMMONIA in the last 168 hours. Coagulation Profile: No results for input(s): INR, PROTIME in the last 168 hours. Cardiac Enzymes: Recent Labs  Lab 08/10/19 0500  CKTOTAL 54   BNP (last 3  results) No results for input(s): PROBNP in the last 8760 hours. HbA1C: No results for input(s): HGBA1C in the last 72 hours. CBG: No results for input(s): GLUCAP in the last 168 hours. Lipid Profile: No results for input(s): CHOL, HDL, LDLCALC, TRIG, CHOLHDL, LDLDIRECT in the last 72 hours. Thyroid Function Tests: No results for input(s): TSH, T4TOTAL, FREET4, T3FREE, THYROIDAB in the last 72 hours. Anemia Panel: No results for input(s): VITAMINB12, FOLATE, FERRITIN, TIBC, IRON, RETICCTPCT in the last 72 hours. Sepsis Labs: Recent Labs  Lab 08/10/19 3329  LATICACIDVEN 1.1    Recent Results (from the past 240 hour(s))  SARS Coronavirus 2 by RT PCR (hospital order, performed in [facility] hospital lab) Nasopharyngeal Throat     Status: None   Collection Time: 08/09/19  9:17 PM   Specimen: Throat; Nasopharyngeal  Result Value Ref Range Status   SARS Coronavirus 2 NEGATIVE NEGATIVE Final    Comment: (NOTE) SARS-CoV-2 target nucleic acids are NOT DETECTED.  The SARS-CoV-2 RNA is generally detectable in upper and lower respiratory specimens during the acute phase of infection. The lowest concentration of SARS-CoV-2 viral copies this assay can detect is 250 copies / mL. A negative  result does not preclude SARS-CoV-2 infection and should not be used as the sole basis for treatment or other patient management decisions.  A negative result may occur with improper specimen collection / handling, submission of specimen other than nasopharyngeal swab, presence of viral mutation(s) within the areas targeted by this assay, and inadequate number of viral copies (<250 copies / mL). A negative result must be combined with clinical observations, patient history, and epidemiological information.  Fact Sheet for Patients:   StrictlyIdeas.no  Fact Sheet for Healthcare Providers: BankingDealers.co.za  This test is not yet approved or  cleared by the [place] FDA and has been authorized for detection and/or diagnosis of SARS-CoV-2 by FDA under an Emergency Use Authorization (EUA).  This EUA will remain in effect (meaning this test can be used) for the duration of the COVID-19 declaration under Section 564(b)(1) of the Act, 21 U.S.C. section 360bbb-3(b)(1), unless the authorization is terminated or revoked sooner.  Performed at [facility], [place] [address] [place], [place] [address]   Group A Strep by PCR     Status: None   Collection Time: 08/09/19  9:17 PM   Specimen: Throat; Sterile Swab  Result Value Ref Range Status   Group A Strep by PCR NOT DETECTED NOT DETECTED Final    Comment: Performed at [facility], [place] [address], [place], [place] [address]         Radiology Studies: CT ABDOMEN PELVIS W CONTRAST  Result Date: 08/14/2019 CLINICAL DATA:  Worsening right lower quadrant abdominal pain. EXAM: CT ABDOMEN AND PELVIS WITH CONTRAST TECHNIQUE: Multidetector CT imaging of the abdomen and pelvis was performed using the standard protocol following bolus administration of intravenous contrast. CONTRAST:  185mL OMNIPAQUE IOHEXOL 300 MG/ML  SOLN COMPARISON:  Pelvic ultrasound yesterday. Abdominal CT  08/11/2019 FINDINGS: Lower chest: Lung bases are clear. Hepatobiliary: No focal hepatic abnormality. There is high-density material in the gallbladder not seen on prior exam. This may represent vicarious excretion of IV contrast. No calcified gallstone or pericholecystic inflammation. Pancreas: No ductal dilatation or inflammation. Spleen: Tiny subcapsular hypodensity in the anterior spleen, unchanged. Adrenals/Urinary Tract: Normal adrenal glands. No hydronephrosis. There is early excretion of contrast within the renal collecting systems. No perinephric edema. Urinary bladder is physiologically distended. No bladder wall thickening.  Stomach/Bowel: Bowel evaluation is limited in the absence of enteric contrast and paucity of intra-abdominal fat. Minimal edema in the pericolonic fat adjacent to the ascending colon, series 3, image 45, not seen on prior exam. No colonic wall thickening. Portions of normal appendix are visualized, series 3, image 78. There is no appendicitis. No terminal ileal inflammation. Decreased stool burden from prior. Vascular/Lymphatic: Normal caliber abdominal aorta. Patent portal vein. No bulky abdominopelvic adenopathy. Reproductive: IUD appropriately position in the uterus. Ovaries are symmetric. Mildly prominent adnexal and periuterine vascularity. In seen mild prominence of the left ovarian vein. Other: Trace free fluid in the pelvis. No free air or focal abscess. Musculoskeletal: There are no acute or suspicious osseous abnormalities. Hemi transitional lumbosacral anatomy. IMPRESSION: 1. Normal appendix. 2. Minimal edema in the pericolonic fat adjacent to the ascending colon, not seen on prior exam. In the setting of right-sided pain this may represent epiploic appendagitis or omental infarct. 3. Similar findings second be seen with pelvic congestion syndrome from recent exam. Electronically Signed   By: Keith Rake M.D.   On: 08/14/2019 19:05   DG ABD ACUTE 2+V W 1V  CHEST  Result Date: 08/13/2019 CLINICAL DATA:  Pain with constipation EXAM: DG ABDOMEN ACUTE W/ 1V CHEST COMPARISON:  CT abdomen and pelvis August 11, 2019; chest radiograph August 09, 2019 FINDINGS: PA chest: Lungs are clear. Heart size and pulmonary vascularity are normal. No adenopathy. Supine and upright abdomen: There is moderate stool in the colon. There is no bowel dilatation or air-fluid level to suggest bowel obstruction. No free air. Intrauterine device in mid pelvis. IMPRESSION: No bowel obstruction or free air. Intrauterine device in mid pelvis. Lungs clear. Electronically Signed   By: Lowella Grip III M.D.   On: 08/13/2019 14:58   US PELVIC COMPLETE WITH TRANSVAGINAL  Result Date: 08/13/2019 CLINICAL DATA:  Pelvic congestion syndrome. EXAM: TRANSABDOMINAL AND TRANSVAGINAL ULTRASOUND OF PELVIS TECHNIQUE: Both transabdominal and transvaginal ultrasound examinations of the pelvis were performed. Transabdominal technique was performed for global imaging of the pelvis including uterus, ovaries, adnexal regions, and pelvic cul-de-sac. It was necessary to proceed with endovaginal exam following the transabdominal exam to visualize the ovaries and adnexa. COMPARISON:  Abdominal CT 08/11/2019, pelvic ultrasound report 01/14/2018, images not available. FINDINGS: Uterus Measurements: 7.1 x 2.8 x 5.1 cm = volume: 54 mL. No fibroids or other mass visualized. Endometrium Thickness: 3 mm. Linear echogenic IUD appropriately position in the endometrium. No focal abnormality visualized. Right ovary Measurements: 3.8 x 1.9 x 1.8 cm = volume: 6.8 mL. Normal appearance with physiologic follicles. No adnexal mass. Ovarian blood flow is noted. Left ovary Measurements: 3.9 x 2.2 x 2.2 cm = volume: 9.5 mL. Normal appearance with physiologic follicles. No adnexal mass. Ovarian blood flow is noted. Other findings No abnormal free fluid. IMPRESSION: 1. Unremarkable pelvic ultrasound. 2. IUD normally position in the endometrium.  Electronically Signed   By: Keith Rake M.D.   On: 08/13/2019 18:53        Scheduled Meds: . budesonide  0.25 mg Inhalation BID  . enoxaparin (LOVENOX) injection  40 mg Subcutaneous Daily  . haloperidol lactate  0.5 mg Intravenous BID  . pantoprazole  40 mg Oral BID  . sertraline  75 mg Oral Daily   Continuous Infusions: . sodium chloride 75 mL/hr at 08/14/19 0426     LOS: 2 days    Time spent:40 min    Brantleigh Mifflin, Geraldo Docker, MD [facility] Pager [phone]  If 7PM-7AM, please contact  night-coverage [email or web address] Password [email or web address] 08/14/2019, 9:02 PM

## 2019-08-15 LAB — CBC WITH DIFFERENTIAL/PLATELET
Abs Immature Granulocytes: 0.01 10*3/uL (ref 0.00–0.07)
Basophils Absolute: 0 10*3/uL (ref 0.0–0.1)
Basophils Relative: 1 %
Eosinophils Absolute: 0.1 10*3/uL (ref 0.0–0.5)
Eosinophils Relative: 2 %
HCT: 35.3 % — ABNORMAL LOW (ref 36.0–46.0)
Hemoglobin: 12.2 g/dL (ref 12.0–15.0)
Immature Granulocytes: 0 %
Lymphocytes Relative: 33 %
Lymphs Abs: 2.1 10*3/uL (ref 0.7–4.0)
MCH: 32.5 pg (ref 26.0–34.0)
MCHC: 34.6 g/dL (ref 30.0–36.0)
MCV: 94.1 fL (ref 80.0–100.0)
Monocytes Absolute: 0.6 10*3/uL (ref 0.1–1.0)
Monocytes Relative: 10 %
Neutro Abs: 3.5 10*3/uL (ref 1.7–7.7)
Neutrophils Relative %: 54 %
Platelets: 193 10*3/uL (ref 150–400)
RBC: 3.75 MIL/uL — ABNORMAL LOW (ref 3.87–5.11)
RDW: 11.7 % (ref 11.5–15.5)
WBC: 6.3 10*3/uL (ref 4.0–10.5)
nRBC: 0 % (ref 0.0–0.2)

## 2019-08-15 LAB — COMPREHENSIVE METABOLIC PANEL
ALT: 22 U/L (ref 0–44)
AST: 18 U/L (ref 15–41)
Albumin: 2.9 g/dL — ABNORMAL LOW (ref 3.5–5.0)
Alkaline Phosphatase: 48 U/L (ref 38–126)
Anion gap: 10 (ref 5–15)
BUN: <5 mg/dL — ABNORMAL LOW (ref 6–20)
CO2: 21 mmol/L — ABNORMAL LOW (ref 22–32)
Calcium: 8.4 mg/dL — ABNORMAL LOW (ref 8.9–10.3)
Chloride: 108 mmol/L (ref 98–111)
Creatinine, Ser: 0.76 mg/dL (ref 0.44–1.00)
GFR calc Af Amer: >60 mL/min (ref >60)
GFR calc non Af Amer: >60 mL/min (ref >60)
Glucose, Bld: 82 mg/dL (ref 70–99)
Potassium: 3.8 mmol/L (ref 3.5–5.1)
Sodium: 139 mmol/L (ref 135–145)
Total Bilirubin: 0.9 mg/dL (ref 0.3–1.2)
Total Protein: 5.4 g/dL — ABNORMAL LOW (ref 6.5–8.1)

## 2019-08-15 LAB — MAGNESIUM: Magnesium: 1.9 mg/dL (ref 1.7–2.4)

## 2019-08-15 LAB — PHOSPHORUS: Phosphorus: 4.4 mg/dL (ref 2.5–4.6)

## 2019-08-15 MED ORDER — IBUPROFEN 400 MG PO TABS
600.0000 mg | ORAL_TABLET | Freq: Three times a day (TID) | ORAL | Status: DC
  Administered 2019-08-15 – 2019-08-16 (×4): 600 mg via ORAL
  Filled 2019-08-15 (×4): qty 1

## 2019-08-15 MED ORDER — TRAMADOL HCL 50 MG PO TABS
50.0000 mg | ORAL_TABLET | Freq: Three times a day (TID) | ORAL | Status: DC
  Administered 2019-08-15 – 2019-08-16 (×4): 50 mg via ORAL
  Filled 2019-08-15 (×4): qty 1

## 2019-08-15 MED ORDER — HYDROCORTISONE (PERIANAL) 2.5 % EX CREA
1.0000 "application " | TOPICAL_CREAM | Freq: Four times a day (QID) | CUTANEOUS | Status: DC | PRN
  Administered 2019-08-15 – 2019-08-16 (×2): 1 via TOPICAL
  Filled 2019-08-15: qty 28.35

## 2019-08-15 MED ORDER — POLYETHYLENE GLYCOL 3350 17 G PO PACK
17.0000 g | PACK | Freq: Every day | ORAL | Status: DC | PRN
  Administered 2019-08-15 – 2019-08-16 (×2): 17 g via ORAL
  Filled 2019-08-15 (×2): qty 1

## 2019-08-15 MED ORDER — HYDROCORTISONE ACETATE 25 MG RE SUPP
25.0000 mg | Freq: Once | RECTAL | Status: AC
  Administered 2019-08-15: 25 mg via RECTAL
  Filled 2019-08-15: qty 1

## 2019-08-15 MED ORDER — WHITE PETROLATUM EX OINT
TOPICAL_OINTMENT | CUTANEOUS | Status: AC
  Filled 2019-08-15: qty 28.35

## 2019-08-15 NOTE — Progress Notes (Signed)
Updated pt and pt mother at bedside on plan of care  Provided educational handouts on IBS, IBS diet control, and abdominal pain with coresponding images   Assisted in ordering pt full liquid diet tray for lunch Pt and pt mother have no questions at this time Will continue to monitor

## 2019-08-15 NOTE — Consult Note (Addendum)
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  Histrionic, patient has come in with abdominal pain with most likely secondary to irritable bowel syndrome.  Referring Physician: Dia Crawford, MD Patient Identification: Candace King MRN:  [id number] Principal Diagnosis: Tachycardia Diagnosis:  Principal Problem:   Tachycardia Active Problems:   Asthma   Depression   Abdominal pain   Pelvic congestion syndrome   Epiploic appendagitis   Total Time spent with patient: 45 minutes  Subjective:   Candace King is a 19 y.o. female patient admitted with nausea and vomiting, dizziness.  Patient with history of irritable bowel syndrome, anxiety, panic disorder, and asthma.  Patient is seen and mother is at the bedside, consent is obtained to proceed with the evaluation in presence of mother.  As per patient she has a history of anxiety and panic disorder that was diagnosed in 05-22-2015 after the death of her maternal grandmother.  She is currently seeing Dr. Creig Hines who has prescribed Zoloft 75 mg p.o. daily and Xanax 1 mg and Candace King.  She denies any other psychiatric history, trauma, and or psychiatric diagnosis.  Per patient " Dr. Berline Lopes is concerned that I am taking too many Xanax.  I am prescribed Xanax by my psychiatrist, and I only take about 1 Xanax a month.  However the nursing staff here as well as Dr. Sherral Hammers are making me anxious.  Dr. Sherral Hammers at home, tach, and delivery is very harsh and therefore I have had to take more than I usually will take.  "  Patient's mother who is also at the bedside is able to confirm, and they are concerned about Dr. Sherral Hammers delivery of medical information.  Patient denies any suicide attempt, suicidal threats, suicidal ideation and or gestures, and nonsuicidal self-injurious behavior.  HPI:  Candace King is a 19 y.o. female with history of asthma and depression presents to the ER  after patient became acutely nauseated with dizziness and short of breath after visiting church yesterday around 11 AM.  Patient states that she had taken the bread which usually is dipped in grape juice during the holy communion but looks like yesterday it was dipped in the wine and she had taken it for the first time.  5 minutes later patient became nauseated started feeling dizzy not feeling well and became short of breath.  At home she also had a temperature for which she took Advil.  Patient also took 1 dose of prochlorperazine for nausea at home.  Since patient symptoms persisted patient came to the ER  During the evaluation patient is alert and oriented, calm and cooperative, and engages well with Probation officer.  Patient answers questions appropriately..  Patient's mood is euthymic, and appears to be future oriented and invested in her treatment and health.  Patient's affect is congruent, and appears to be responsive to treatment.  Patient has been compliant with her psychiatric team to include Dr. Creig Hines and her therapist with next follow-up next week.  She currently denies any active or recent suicidal ideations.      Past Psychiatric History: Diagnosed with anxiety and panic disorder.  Patient appears to be compliant taking Zoloft 75 mg p.o. daily and Xanax 1 mg ODT as needed for anxiety.  This prescription was confirmed by PDMP, last filled in February 04, 2018 quantity #180 with the prescriber being Dr. Creig Hines.  Patient is under the management of a Candace King. patient denies  any inpatient admissions.  Patient denies any previous suicide attempts.  Patient denies any history of substance abuse.  Risk to Self:  Denies Risk to Others:  Denies Prior Inpatient Therapy:  Denies Prior Outpatient Therapy:  See above  Past Medical History:  Past Medical History:  Diagnosis Date   Asthma    Telangiectasia     Past Surgical History:  Procedure Laterality Date   TONSILLECTOMY     Family History:   Family History  Problem Relation Age of Onset   Anxiety disorder Mother    Breast cancer Paternal Grandmother    Family Psychiatric  History: Per mom maternal grandmother has a history of Lewy bodies dementia and passed away in 05/22/15.  Per mom father has a diagnosis of schizoaffective bipolar type, paternal grandmother and paternal uncle both with the diagnosis of bipolar. Social History:  Social History   Substance and Sexual Activity  Alcohol Use No     Social History   Substance and Sexual Activity  Drug Use Not Currently    Social History   Socioeconomic History   Marital status: Single    Spouse name: Not on file   Number of children: Not on file   Years of education: Not on file   Highest education level: Not on file  Occupational History   Not on file  Tobacco Use   Smoking status: Never Smoker   Smokeless tobacco: Never Used  Substance and Sexual Activity   Alcohol use: No   Drug use: Not Currently   Sexual activity: Not on file  Other Topics Concern   Not on file  Social History Narrative   Not on file   Social Determinants of Health   Financial Resource Strain:    Difficulty of Paying Living Expenses:   Food Insecurity:    Worried About Charity fundraiser in the Last Year:    Arboriculturist in the Last Year:   Transportation Needs:    Film/video editor (Medical):    Lack of Transportation (Non-Medical):   Physical Activity:    Days of Exercise per Week:    Minutes of Exercise per Session:   Stress:    Feeling of Stress :   Social Connections:    Frequency of Communication with Friends and Family:    Frequency of Social Gatherings with Friends and Family:    Attends Religious Services:    Active Member of Clubs or Organizations:    Attends Archivist Meetings:    Marital Status:    Additional Social History:    Allergies:   Allergies  Allergen Reactions   Augmentin [Amoxicillin-Pot Clavulanate] Diarrhea   Morphine And  Related     Metal taste    Labs:  Results for orders placed or performed during the hospital encounter of 08/09/19 (from the past 48 hour(s))  Comprehensive metabolic panel     Status: Abnormal   Collection Time: 08/14/19  7:59 AM  Result Value Ref Range   Sodium 138 135 - 145 mmol/L   Potassium 3.9 3.5 - 5.1 mmol/L   Chloride 112 (H) 98 - 111 mmol/L   CO2 20 (L) 22 - 32 mmol/L   Glucose, Bld 90 70 - 99 mg/dL    Comment: Glucose reference range applies only to samples taken after fasting for at least 8 hours.   BUN <5 (L) 6 - 20 mg/dL   Creatinine, Ser 0.62 0.44 - 1.00 mg/dL   Calcium 8.3 (L) 8.9 -  10.3 mg/dL   Total Protein 4.9 (L) 6.5 - 8.1 g/dL   Albumin 2.9 (L) 3.5 - 5.0 g/dL   AST 15 15 - 41 U/L   ALT 18 0 - 44 U/L   Alkaline Phosphatase 45 38 - 126 U/L   Total Bilirubin 0.5 0.3 - 1.2 mg/dL   GFR calc non Af Amer >60 >60 mL/min   GFR calc Af Amer >60 >60 mL/min   Anion gap 6 5 - 15    Comment: Performed at [facility] [address], [place], [place] [address]  Magnesium     Status: None   Collection Time: 08/14/19  7:59 AM  Result Value Ref Range   Magnesium 2.0 1.7 - 2.4 mg/dL    Comment: Performed at [facility] [address], [place], [place] [address]  Phosphorus     Status: None   Collection Time: 08/14/19  7:59 AM  Result Value Ref Range   Phosphorus 4.5 2.5 - 4.6 mg/dL    Comment: Performed at [facility] [address] [place], [place] [address]  CBC with Differential/Platelet     Status: Abnormal   Collection Time: 08/14/19  7:59 AM  Result Value Ref Range   WBC 6.0 4.0 - 10.5 K/uL   RBC 3.77 (L) 3.87 - 5.11 MIL/uL   Hemoglobin 12.1 12.0 - 15.0 g/dL   HCT 35.3 (L) 36 - 46 %   MCV 93.6 80.0 - 100.0 fL   MCH 32.1 26.0 - 34.0 pg   MCHC 34.3 30.0 - 36.0 g/dL   RDW 11.9 11.5 - 15.5 %   Platelets 174 150 - 400 K/uL   nRBC 0.0 0.0 - 0.2 %   Neutrophils Relative % 47 %   Neutro Abs 2.9 1.7 - 7.7 K/uL   Lymphocytes Relative 40 %    Lymphs Abs 2.4 0.7 - 4.0 K/uL   Monocytes Relative 9 %   Monocytes Absolute 0.5 0 - 1 K/uL   Eosinophils Relative 3 %   Eosinophils Absolute 0.2 0 - 0 K/uL   Basophils Relative 1 %   Basophils Absolute 0.0 0 - 0 K/uL   Immature Granulocytes 0 %   Abs Immature Granulocytes 0.01 0.00 - 0.07 K/uL    Comment: Performed at [facility] [address], [place], [place] [address]  C-reactive protein     Status: None   Collection Time: 08/14/19 12:47 PM  Result Value Ref Range   CRP 0.5 <1.0 mg/dL    Comment: Performed at [facility], [place] [address] [place], [place] [address]  Sedimentation rate     Status: None   Collection Time: 08/14/19 12:47 PM  Result Value Ref Range   Sed Rate 4 0 - 22 mm/hr    Comment: Performed at [facility], [place] [address], [place],  [address]  Comprehensive metabolic panel     Status: Abnormal   Collection Time: 08/15/19  4:37 AM  Result Value Ref Range   Sodium 139 135 - 145 mmol/L   Potassium 3.8 3.5 - 5.1 mmol/L   Chloride 108 98 - 111 mmol/L   CO2 21 (L) 22 - 32 mmol/L   Glucose, Bld 82 70 - 99 mg/dL    Comment: Glucose reference range applies only to samples taken after fasting for at least 8 hours.   BUN <5 (L) 6 - 20 mg/dL   Creatinine, Ser 0.76 0.44 - 1.00 mg/dL  Calcium 8.4 (L) 8.9 - 10.3 mg/dL   Total Protein 5.4 (L) 6.5 - 8.1 g/dL   Albumin 2.9 (L) 3.5 - 5.0 g/dL   AST 18 15 - 41 U/L   ALT 22 0 - 44 U/L   Alkaline Phosphatase 48 38 - 126 U/L   Total Bilirubin 0.9 0.3 - 1.2 mg/dL   GFR calc non Af Amer >60 >60 mL/min   GFR calc Af Amer >60 >60 mL/min   Anion gap 10 5 - 15    Comment: Performed at [facility] [address], [place], [place] [address]  Magnesium     Status: None   Collection Time: 08/15/19  4:37 AM  Result Value Ref Range   Magnesium 1.9 1.7 - 2.4 mg/dL    Comment: Performed at [facility] [address], [place], [place] [address]  Phosphorus     Status: None   Collection Time:  08/15/19  4:37 AM  Result Value Ref Range   Phosphorus 4.4 2.5 - 4.6 mg/dL    Comment: Performed at [facility] [address], [place], [place] [address]  CBC with Differential/Platelet     Status: Abnormal   Collection Time: 08/15/19  4:37 AM  Result Value Ref Range   WBC 6.3 4.0 - 10.5 K/uL   RBC 3.75 (L) 3.87 - 5.11 MIL/uL   Hemoglobin 12.2 12.0 - 15.0 g/dL   HCT 35.3 (L) 36 - 46 %   MCV 94.1 80.0 - 100.0 fL   MCH 32.5 26.0 - 34.0 pg   MCHC 34.6 30.0 - 36.0 g/dL   RDW 11.7 11.5 - 15.5 %   Platelets 193 150 - 400 K/uL   nRBC 0.0 0.0 - 0.2 %   Neutrophils Relative % 54 %   Neutro Abs 3.5 1.7 - 7.7 K/uL   Lymphocytes Relative 33 %   Lymphs Abs 2.1 0.7 - 4.0 K/uL   Monocytes Relative 10 %   Monocytes Absolute 0.6 0 - 1 K/uL   Eosinophils Relative 2 %   Eosinophils Absolute 0.1 0 - 0 K/uL   Basophils Relative 1 %   Basophils Absolute 0.0 0 - 0 K/uL   Immature Granulocytes 0 %   Abs Immature Granulocytes 0.01 0.00 - 0.07 K/uL    Comment: Performed at [facility], [address], [place], [place] [address]    Current Facility-Administered Medications  Medication Dose Route Frequency Provider Last Rate Last Admin   0.9 %  sodium chloride infusion   Intravenous Continuous Allie Bossier, MD 75 mL/hr at 08/15/19 0215 New Bag at 08/15/19 0215   acetaminophen (TYLENOL) tablet 650 mg  650 mg Oral Q6H PRN Charlynne Cousins, MD   650 mg at 08/15/19 0912   ALPRAZolam (XANAX) tablet 1 mg  1 mg Oral QHS PRN Opyd, Ilene Qua, MD   1 mg at 08/14/19 2207   budesonide (PULMICORT) nebulizer solution 0.25 mg  0.25 mg Inhalation BID Rise Patience, MD   0.25 mg at 08/15/19 0845   dicyclomine (BENTYL) capsule 10 mg  10 mg Oral QID PRN Salley Slaughter, PA-C   10 mg at 08/14/19 2208   enoxaparin (LOVENOX) injection 40 mg  40 mg Subcutaneous Daily Rise Patience, MD   40 mg at 08/15/19 0913   haloperidol lactate (HALDOL) injection 0.5 mg  0.5 mg Intravenous BID  Allie Bossier, MD   0.5 mg at 08/14/19 1505   hydrocortisone (ANUSOL-HC)  2.5 % rectal cream 1 application  1 application Topical QID PRN Allie Bossier, MD       ibuprofen (ADVIL) tablet 600 mg  600 mg Oral TID WC Allie Bossier, MD   600 mg at 08/15/19 1126   ketorolac (TORADOL) 15 MG/ML injection 15 mg  15 mg Intravenous Q6H PRN Charlynne Cousins, MD   15 mg at 08/14/19 2208   levalbuterol (XOPENEX) nebulizer solution 0.63 mg  0.63 mg Nebulization Q6H PRN Rise Patience, MD       morphine 4 MG/ML injection 4 mg  4 mg Intravenous Q2H PRN Charlynne Cousins, MD       ondansetron [facility]) tablet 4 mg  4 mg Oral Q6H PRN Rise Patience, MD       Or   ondansetron [facility]) injection 4 mg  4 mg Intravenous Q6H PRN Rise Patience, MD   4 mg at 08/12/19 0333   pantoprazole (PROTONIX) EC tablet 40 mg  40 mg Oral BID Charlynne Cousins, MD   40 mg at 08/15/19 0912   prochlorperazine (COMPAZINE) tablet 5 mg  5 mg Oral Q8H PRN Charlynne Cousins, MD   5 mg at 08/12/19 0806   sertraline (ZOLOFT) tablet 75 mg  75 mg Oral Daily Charlynne Cousins, MD   75 mg at 08/15/19 0913   traMADol (ULTRAM) tablet 50 mg  50 mg Oral TID Allie Bossier, MD   50 mg at 08/15/19 1126    Musculoskeletal: Strength & Muscle Tone: within normal limits Gait & Station: normal Patient leans: N/A  Psychiatric Specialty Exam: Physical Exam  Review of Systems  Blood pressure 119/70, pulse (!) 107, temperature 97.8 F (36.6 C), temperature source Oral, resp. rate 16, height 5\' 4"  (1.626 m), weight 56.7 kg, SpO2 99 %.Body mass index is 21.46 kg/m.  General Appearance: Fairly Groomed  Eye Contact:  Good  Speech:  Clear and Coherent and Normal Rate  Volume:  Normal  Mood:  Anxious  Affect:  Appropriate and Congruent  Thought Process:  Coherent, Linear and Descriptions of Associations: Intact  Orientation:  Full (Time, Place, and Person)  Thought Content:  WDL  Suicidal Thoughts:  No  Homicidal  Thoughts:  No  Memory:  Immediate;   Good Recent;   Good Remote;   Good  Judgement:  Intact  Insight:  Fair  Psychomotor Activity:  Normal  Concentration:  Concentration: Good and Attention Span: Good  Recall:  [employer] of Knowledge:  Fair  Language:  Fair  Akathisia:  No  Handed:  Right  AIMS (if indicated):     Assets:  Communication Skills Desire for Improvement Financial Resources/Insurance Leisure Time Physical Health Social Support  ADL's:  Intact  Cognition:  WNL  Sleep:      Candace King is a 19 y.o. female with history of asthma, anxiety, panic disorder, irritable bowel syndrome who presented to the emergency room with complaints of abdominal pain, dizziness, and nausea x6 days.  Psychiatry was consulted due to concerns about borderline personality disorder, and or histrionic disorder.  Patient has had a completely normal work-up, and continues to have ongoing abdominal pain that has been unexplained to this point.  Upon speaking with patient she has no concerns about her medication use as she reports situational anxiety that is more likely define his social anxiety due to being in the hospital.  She denies any suicidal ideations.  Her anxiety does appear to be causing  an impairment as well as debilitating when placed in certain situations, and therefore a short-term medication such as Xanax is appropriate in this situation.  Patient has been compliant with her psychiatrist, as well as her therapist for the past 4 years and is taking her medication appropriately.  1 consideration is to discontinue the Zoloft as this could be exacerbating her nausea and vomiting.  However such medication changes can take place in an outpatient setting.  Further evaluation for borderline personality disorder and or histrionic disorder does require an outpatient evaluation and extensive history.  Based off current findings patient has social anxiety, and panic disorder and is on appropriate  medication.  Treatment Plan Summary: Plan Will continue with current medication as noted above.  Patient may benefit from family therapy considering impact on family dynamics to include parental divorce, grief and loss from grandmother and grandfather.  In terms of ongoing GI pain with no explanation did discuss with patient and mother a possible referral to functional medicine for IBS.   Disposition: No evidence of imminent risk to self or others at present.   Patient does not meet criteria for psychiatric inpatient admission. Supportive therapy provided about ongoing stressors.  Suella Broad, FNP 08/15/2019 1:04 PM Patient seen face-to-face for psychiatric evaluation, chart reviewed and case discussed with the physician extender and developed treatment plan. Reviewed the information documented and agree with the treatment plan. Corena Pilgrim, MD

## 2019-08-15 NOTE — Plan of Care (Signed)
  Problem: Activity: Goal: Risk for activity intolerance will decrease Outcome: Progressing   Problem: Nutrition: Goal: Adequate nutrition will be maintained Outcome: Progressing   

## 2019-08-15 NOTE — Progress Notes (Addendum)
PROGRESS NOTE    PEARSON REASONS  QMG:[id number] DOB: Sep 24, 2000 DOA: 08/09/2019 PCP: Judithann Sauger, MD     Brief Narrative:  Candace King is a 19 y.o. WF PMHx Asthma and depression, IBD (seen by Dr. Paulita Fujita GI)  Presents to the ER after patient became acutely nauseated with dizziness and short of breath after visiting church yesterday around 11 AM.  Patient states that she had taken the bread which usually is dipped in grape juice during the holy communion but looks like yesterday it was dipped in the wine and she had taken it for the first time.  5 minutes later patient became nauseated started feeling dizzy not feeling well and became short of breath.  At home she also had a temperature for which she took Advil.  Patient also took 1 dose of prochlorperazine for nausea at home.  Since patient symptoms persisted patient came to the ER.  ED Course: In the ER patient easily becomes tachycardic on movement.  EKG shows sinus tachycardia with QTC of 456 ms and QRS of 101 ms.  Labs show negative D-dimer with potassium of 3.1.  Anion gap is 18 bicarb is 14.  Patient states her oral medications including sertraline, as needed Xanax Qvar and albuterol are all has been taking for many years.  Denies taking any over-the-counter medications.  Patient was given IV Solu-Medrol for possible asthma exacerbation with nebulizer and also prochlorperazine for nausea.  Patient was started on fluids potassium replacement admitted for further observation given the persistent tachycardia.   Subjective: 7/10 A/O x4, negative CP.  States abdominal pain significantly decreased from yesterday.  Abdominal pain still mostly located RUQ/RLQ.  Currently rates pain 6/10.  States positive postprandial nausea with everything except for water.  Patient states has been on Xanax since she was 19 years old.  Usually only requires to take 1 tab once a month.  Only requires more Xanax when she is in unfamiliar surroundings such as  hospitalized.    Assessment & Plan: Covid vaccination; positive vaccination   Principal Problem:   Tachycardia Active Problems:   Asthma   Depression   Abdominal pain   Pelvic congestion syndrome   Epiploic appendagitis  Abdominal pain/constipation -7/7 SMOG enema + 1/2 bottle of magnesium citrate.  If after patient clears while still having discomfort will consult Dr. Paulita Fujita GI.  Patient may require EGD/colonoscopy although very young for that procedure. 7/5 urine tox screen negative -TSH WNL -7/5 UDS negative -Normal saline 98ml/hr -7/8 consulted GI; recommended the following.  -7/8 Bentyl 10 mg TID  -7/8 Bentyl 10 mg QID PRN  -MiraLAX 1 container x1  -Follow-up as outpatient for EGD/colonoscopy -7/9 multidisciplinary conference held between GI, myself, and RN Havy. Again GI reiterated pain most likely due to irritable bowel syndrome exacerbated by constipation. Colonoscopy currently not indicated. If repeat abdominal/pelvis CT negative discharge patient home. (See [facility] note 7/9) -7/9 schedule follow-up 2 weeks with Dr. Arta Silence GI possible EGD/colonoscopy as an outpatient, IBS mixed type -RN Ave Filter called [facility] attempted to schedule appointment with Dr. Arta Silence, however office stated they would rather call patient directly to schedule appointment. This information was communicated to mother and patient -7/10 stated Haldol was most effective in controlling her pain however made her sleepy, and gave her episodes of chills.  Pain control -Tylenol 650 mg q6hr PRN -Bentyl 10 mg QID PRN -Haldol 0.5 mg BID -Ketorolac 15 mg QID -Motrin 600 mg TID -Tramadol 50 mg TID  Pelvic Congestion syndrome  Discussed case with patient's OB/GYN Dr. Philis Pique at [facility] who reviewed CT scan was unimpressed (does not believe causing abdominal pain) but recommended obtaining pelvic ultrasound.  Pending -Transvaginal ultrasound unremarkable see results below -Schedule  follow-up appointment in 1 to 2 weeks with Dr. Jerelyn Charles at [facility] called [facility] and attempted to obtain follow-up appointment with Dr. Carlis Abbott, office stated they would rather call patient directly to schedule follow-up appointment. This information was communicated to mother and patient  Epiploic appendagitis -7/9 repeat CT scan shows possible epiploic appendagitis or omental infarct see results below -Normally managed conservatively; p.o.  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 Patient has a poor relationship with her father. In addition under stress from mother getting divorced from her father. -7/9 consult to psychiatry.  Spoke at length with patient about my concern for long-term Xanax use patient agreed to speak with psychiatry today to determine if there is a better medication regiment.  States she has been on Xanax and Zoloft for Panic disorder since 19 years old.  Migraine -Home regimen  Prochlorperazine     Goals of care -7/9 PT/OT consult ;patient has no family in town, considerable weakness, abdominal pain.  Will be unable to care for herself evaluate and place recommendation for SNF STAT. PT/OT recommend home health with supervision/assistance 24 hours    DVT prophylaxis: Lovenox Code Status:  Full Family Communication: 7/9 mother present at bedside for discussion of plan of care. Extremely disruptive influence. Consistently interrupts physician or RN when they are trying to explain plan of care to patient or illicit questions from patient concerning her understanding of plan of care. Mother was informed today by myself that patient is a competent adult and therefore we needed to ensure that patient understood plan of care and answered all of her questions. Mother was very welcome to listen and ask pertinent questions.    Status is: Inpatient    Dispo: The patient is from: Home              Anticipated d/c is to: Home              Anticipated d/c date is: 7/10              Patient currently unstable     Consultants:  GI Dr. Wilford [facility] Dr.Horvath at [facility]    Procedures/Significant Events:  7/6 CT abdomen pelvis W contrast; negative acute abnormality -Moderate colonic stool burden possible constipation. -Prominent left adnexal vascularity with minimal reflux contrast in dilatation of the ovarian vein can be seen with pelvic congestion syndrome in the appropriate clinical setting 7/8 US pelvic complete with transvaginal; remarkable pelvic ultrasound-IUD in normal position 7/9 CT abdomen pelvis W contrast;-normal appendix. -Minimal edema in the pericolonic fat adjacent to the ascending colon, not seen on prior  exam. In the setting of right-sided pain this may represent epiploic appendagitis or omental infarct. -Similar findings second be seen with pelvic congestion syndrome from recent exam.   I have personally reviewed and interpreted all radiology studies and my findings are as above.  VENTILATOR SETTINGS:    Cultures 7/4 SARS coronavirus negative 7/4 group A strep by PCR negative 7/5 HIV negative   Antimicrobials:    Devices    LINES / TUBES:      Continuous Infusions: . sodium chloride 75 mL/hr at 08/15/19 0215      Objective: Vitals:   08/14/19 1600 08/14/19 2017 08/14/19 2337 08/15/19 0603  BP: 102/64  111/74 110/74  Pulse: 80  69 63  Resp: 18  18 19   Temp: (!) 97.3 F (36.3 C)  98.3 F (36.8 C) 98.3 F (36.8 C)  TempSrc: Oral  Oral Oral  SpO2: 99% 99% 99% 99%  Weight:      Height:        Intake/Output Summary (Last 24 hours) at 08/15/2019 8921 Last data filed at 08/14/2019 2000 Gross per 24 hour  Intake 3649.11 ml  Output --  Net 3649.11 ml   Filed Weights   08/09/19 2106  Weight: 56.7 kg   Physical Exam:  General: A/O x4, no acute respiratory distress Eyes: negative scleral hemorrhage, negative anisocoria, negative icterus ENT: Negative Runny nose, negative gingival bleeding, Neck:  Negative scars, masses, torticollis, lymphadenopathy, JVD Lungs: Clear to auscultation bilaterally without wheezes or crackles Cardiovascular: Regular rate and rhythm without murmur gallop or rub normal S1 and S2 Abdomen: Positive abdominal pain to palpation RUQ/RLQ>> then the remainder of her abdomen, nondistended, positive soft, bowel sounds, no rebound, no ascites, no appreciable mass Extremities: No significant cyanosis, clubbing, or edema bilateral lower extremities Skin: Negative rashes, lesions, ulcers Psychiatric:  Positive depression,Positive anxiety, negative fatigue, negative mania  Central nervous system:  Cranial nerves II through XII intact, tongue/uvula midline, all extremities muscle strength 5/5, sensation intact throughout, negative dysarthria, negative expressive aphasia, negative receptive aphasia. .     Data Reviewed: Care during the described time interval was provided by me .  I have reviewed this patient's available data, including medical history, events of note, physical examination, and all test results as part of my evaluation.  CBC: Recent Labs  Lab 08/10/19 0500 08/11/19 1747 08/13/19 0257 08/14/19 0759 08/15/19 0437  WBC 2.8* 6.6 7.1 6.0 6.3  NEUTROABS 2.5  4.0 3.7 2.9 3.5  HGB 12.3 14.2 14.3 12.1 12.2  HCT 37.1 40.2 41.1 35.3* 35.3*  MCV 96.1 93.1 93.4 93.6 94.1  PLT 171 219 229 174 194   Basic Metabolic Panel: Recent Labs  Lab 08/10/19 0500 08/10/19 0500 08/11/19 0628 08/12/19 1351 08/13/19 0257 08/14/19 0759 08/15/19 0437  NA 141   < > 140 140 140 138 139  K 4.1   < > 3.8 4.1 3.9 3.9 3.8  CL 116*   < > 109 109 109 112* 108  CO2 19*   < > 24 22 23  20* 21*  GLUCOSE 151*   < > 97 89 90 90 82  BUN <5*   < > <5* 6 7 <5* <5*  CREATININE 0.54   < > 0.77 0.88 0.88 0.62 0.76  CALCIUM 7.9*   < > 8.3* 9.0 8.5* 8.3* 8.4*  MG 1.7  --   --  2.0 2.8* 2.0 1.9  PHOS  --   --   --  5.5* 4.9* 4.5 4.4   < > =  values in this interval not displayed.   GFR: Estimated Creatinine Clearance: 97.7 mL/min (by C-G formula based on SCr of 0.76 mg/dL). Liver Function Tests: Recent Labs  Lab 08/10/19 0500 08/12/19 1351 08/13/19 0257 08/14/19 0759 08/15/19 0437  AST 15 18 17 15 18   ALT 12 19 18 18 22   ALKPHOS 46 56 56 45 48  BILITOT 0.6 0.9 0.8 0.5 0.9  PROT 5.6* 6.5 5.9* 4.9* 5.4*  ALBUMIN 3.1* 3.8 3.5 2.9* 2.9*   No results for input(s): LIPASE, AMYLASE in the last 168 hours. No results for input(s): AMMONIA in the last 168 hours. Coagulation Profile: No results for input(s): INR, PROTIME in the last 168 hours. Cardiac Enzymes: Recent Labs  Lab 08/10/19 0500  CKTOTAL 54   BNP (last 3 results) No results for input(s): PROBNP in the last 8760 hours. HbA1C: No results for input(s): HGBA1C in the last 72 hours. CBG: No results for input(s): GLUCAP in the last 168 hours. Lipid Profile: No results for input(s): CHOL, HDL, LDLCALC, TRIG, CHOLHDL, LDLDIRECT in the last 72 hours. Thyroid Function Tests: No results for input(s): TSH, T4TOTAL, FREET4, T3FREE, THYROIDAB in the last 72 hours. Anemia Panel: No results for input(s): VITAMINB12, FOLATE, FERRITIN, TIBC, IRON, RETICCTPCT in the last 72 hours. Sepsis Labs: Recent Labs  Lab  08/10/19 6222  LATICACIDVEN 1.1    Recent Results (from the past 240 hour(s))  SARS Coronavirus 2 by RT PCR (hospital order, performed in [facility] hospital lab) Nasopharyngeal Throat     Status: None   Collection Time: 08/09/19  9:17 PM   Specimen: Throat; Nasopharyngeal  Result Value Ref Range Status   SARS Coronavirus 2 NEGATIVE NEGATIVE Final    Comment: (NOTE) SARS-CoV-2 target nucleic acids are NOT DETECTED.  The SARS-CoV-2 RNA is generally detectable in upper and lower respiratory specimens during the acute phase of infection. The lowest concentration of SARS-CoV-2 viral copies this assay can detect is 250 copies / mL. A negative result does not preclude SARS-CoV-2 infection and should not be used as the sole basis for treatment or other patient management decisions.  A negative result may occur with improper specimen collection / handling, submission of specimen other than nasopharyngeal swab, presence of viral mutation(s) within the areas targeted by this assay, and inadequate number of viral copies (<250 copies / mL). A negative result must be combined with clinical observations, patient history, and epidemiological information.  Fact Sheet for Patients:   StrictlyIdeas.no  Fact Sheet for Healthcare Providers: BankingDealers.co.za  This test is not yet approved or  cleared by the [place] FDA and has been authorized for detection and/or diagnosis of SARS-CoV-2 by FDA under an Emergency Use Authorization (EUA).  This EUA will remain in effect (meaning this test can be used) for the duration of the COVID-19 declaration under Section 564(b)(1) of the Act, 21 U.S.C. section 360bbb-3(b)(1), unless the authorization is terminated or revoked sooner.  Performed at [facility], [place] [address] [place], [place] [address]   Group A Strep by PCR     Status: None   Collection Time: 08/09/19  9:17 PM   Specimen:  Throat; Sterile Swab  Result Value Ref Range Status   Group A Strep by PCR NOT DETECTED NOT DETECTED Final    Comment: Performed at [facility], [place] [address], [place], [place] [address]         Radiology Studies: CT ABDOMEN PELVIS W CONTRAST  Result Date: 08/14/2019 CLINICAL DATA:  Worsening right lower quadrant abdominal pain. EXAM: CT ABDOMEN AND PELVIS WITH CONTRAST TECHNIQUE: Multidetector CT imaging of the abdomen and pelvis was performed using the standard protocol following bolus administration of intravenous contrast. CONTRAST:  118mL OMNIPAQUE IOHEXOL 300 MG/ML  SOLN COMPARISON:  Pelvic ultrasound yesterday. Abdominal CT 08/11/2019 FINDINGS: Lower chest: Lung bases are clear. Hepatobiliary: No focal hepatic abnormality. There is high-density material in the gallbladder not seen on prior exam. This may represent vicarious excretion of IV contrast. No calcified gallstone or pericholecystic inflammation. Pancreas: No ductal dilatation or inflammation. Spleen: Tiny subcapsular hypodensity in the anterior spleen, unchanged. Adrenals/Urinary Tract: Normal adrenal glands. No hydronephrosis. There is early excretion of contrast within the renal collecting systems. No perinephric edema. Urinary bladder is physiologically distended. No bladder wall thickening. Stomach/Bowel: Bowel evaluation is limited in the absence of enteric contrast and paucity of intra-abdominal fat. Minimal edema in the pericolonic fat adjacent to the ascending colon, series 3, image 45, not seen on prior exam. No colonic wall thickening. Portions of normal appendix are visualized, series 3, image 78. There is no appendicitis. No terminal ileal inflammation. Decreased stool burden from prior. Vascular/Lymphatic: Normal caliber abdominal aorta. Patent portal vein. No bulky abdominopelvic adenopathy. Reproductive: IUD appropriately position in the uterus. Ovaries are symmetric. Mildly prominent adnexal and periuterine  vascularity. In seen mild prominence of the left ovarian vein. Other: Trace free fluid in the pelvis. No free air or focal abscess. Musculoskeletal: There are no acute or suspicious osseous abnormalities. Hemi transitional lumbosacral anatomy. IMPRESSION: 1. Normal appendix. 2. Minimal edema in the pericolonic fat adjacent to the ascending colon, not seen on prior exam. In the setting of right-sided pain this may represent epiploic appendagitis or omental infarct. 3. Similar findings second be seen with pelvic congestion syndrome from recent exam. Electronically Signed   By: Keith Rake M.D.   On: 08/14/2019 19:05   DG ABD ACUTE 2+V W 1V CHEST  Result Date: 08/13/2019 CLINICAL DATA:  Pain with constipation EXAM: DG ABDOMEN ACUTE W/ 1V CHEST COMPARISON:  CT abdomen and pelvis August 11, 2019; chest radiograph August 09, 2019 FINDINGS: PA chest: Lungs are clear. Heart size and pulmonary vascularity are normal. No adenopathy. Supine and upright abdomen: There is moderate stool in the colon. There is no bowel dilatation or air-fluid level to suggest bowel obstruction. No free air. Intrauterine device in mid pelvis. IMPRESSION: No bowel obstruction or free air. Intrauterine device in mid pelvis. Lungs clear. Electronically Signed   By: Lowella Grip III M.D.   On: 08/13/2019 14:58   US PELVIC COMPLETE WITH TRANSVAGINAL  Result Date: 08/13/2019 CLINICAL DATA:  Pelvic congestion syndrome. EXAM: TRANSABDOMINAL AND TRANSVAGINAL ULTRASOUND OF PELVIS TECHNIQUE: Both transabdominal and transvaginal ultrasound examinations of the pelvis were performed. Transabdominal technique was performed for global imaging of the pelvis including uterus, ovaries, adnexal regions, and pelvic cul-de-sac. It was necessary to proceed with endovaginal exam following the transabdominal exam to visualize the ovaries and adnexa. COMPARISON:  Abdominal CT 08/11/2019, pelvic ultrasound report 01/14/2018, images not available. FINDINGS: Uterus  Measurements: 7.1 x 2.8 x 5.1 cm = volume: 54 mL. No fibroids or other mass visualized. Endometrium Thickness: 3 mm. Linear echogenic IUD appropriately position in the endometrium. No focal abnormality visualized. Right ovary Measurements: 3.8 x 1.9 x 1.8 cm = volume: 6.8 mL. Normal appearance with physiologic follicles. No adnexal mass. Ovarian blood flow is noted. Left ovary Measurements: 3.9 x 2.2 x 2.2 cm = volume: 9.5 mL. Normal appearance with  physiologic follicles. No adnexal mass. Ovarian blood flow is noted. Other findings No abnormal free fluid. IMPRESSION: 1. Unremarkable pelvic ultrasound. 2. IUD normally position in the endometrium. Electronically Signed   By: Keith Rake M.D.   On: 08/13/2019 18:53        Scheduled Meds: . budesonide  0.25 mg Inhalation BID  . enoxaparin (LOVENOX) injection  40 mg Subcutaneous Daily  . haloperidol lactate  0.5 mg Intravenous BID  . pantoprazole  40 mg Oral BID  . sertraline  75 mg Oral Daily   Continuous Infusions: . sodium chloride 75 mL/hr at 08/15/19 0215     LOS: 3 days    Time spent:40 min    Kyleeann Cremeans, Geraldo Docker, MD [facility] Pager [phone]  If 7PM-7AM, please contact night-coverage [email or web address] Password [facility] 08/15/2019, 7:42 AM

## 2019-08-15 NOTE — Consult Note (Signed)
[facility] Surgery Consult Note  Candace King Jan 11, 2001  [id number].    Requesting MD: Dia Crawford Chief Complaint/Reason for Consult: abdominal pain  HPI:  Candace King is a 19yo female PMH asthma, depression, and chronic abdominal pain likely secondary to IBS who was admitted to [facility] 7/4 with worsening abdominal pain, nausea, and dizziness. At that time her pain had been worse for about 1 week. Pain was mostly in her lower abdomen. She had had recent constipation but due to IBS she did have some non-bloody diarrhea as well. CT scan 7/6 with moderate colonic stool burden and no acute abnormality; concern for possible pelvic congestion syndrome. Patient was admitted to the medical service for observation. She was initially started on a bowel regimen with small amount of relief in her symptoms. 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 She was started on Bentyl, protonix continued, and miralax PRN. They are planning for outpatient EGD and colonoscopy. The case was also discussed with OB/GYN who recommend pelvic u/s which was unremarkable and showed IUD in normal position. They did not feel that she had pelvic congestion syndrome.  Yesterday the patient was still complaining that her symptoms had not resolved. CT scan was repeated and reports normal appendix, similar findings that can be seen with pelvic congestion syndrome, and minimal edema in the pericolonic fat adjacent to the ascending colon that could represent epiploic appendagitis or omental infarct. WBC 6.3, afebrile, VSS. General surgery asked to see. Psychiatry has also been asked to consult.  Abdominal surgical history: none  Review of Systems  Constitutional: Negative.   Respiratory: Negative.   Gastrointestinal: Positive for abdominal pain, constipation, diarrhea and nausea.  Genitourinary: Negative.   Musculoskeletal: Negative.   Skin: Negative.    All  systems reviewed and otherwise negative except for as above  Family History  Problem Relation Age of Onset   Anxiety disorder Mother    Breast cancer Paternal Grandmother     Past Medical History:  Diagnosis Date   Asthma    Telangiectasia     Past Surgical History:  Procedure Laterality Date   TONSILLECTOMY      Social History:  reports that she has never smoked. She has never used smokeless tobacco. She reports previous drug use. She reports that she does not drink alcohol.  Allergies:  Allergies  Allergen Reactions   Augmentin [Amoxicillin-Pot Clavulanate] Diarrhea   Morphine And Related     Metal taste    Medications Prior to Admission  Medication Sig Dispense Refill   albuterol (PROVENTIL HFA;VENTOLIN HFA) 108 (90 Base) MCG/ACT inhaler Inhale into the lungs every 6 (six) hours as needed for wheezing or shortness of breath.     ALPRAZolam (XANAX) 1 MG tablet Take 1 mg by mouth at bedtime as needed for anxiety.     Naphazoline HCl (CLEAR EYES OP) Place 1 drop into both eyes daily as needed (For dry eyes).     omeprazole (PRILOSEC) 20 MG capsule Take 20 mg by mouth daily.     Probiotic Product (PROBIOTIC PO) Take 1 tablet by mouth daily.     prochlorperazine (COMPAZINE) 5 MG tablet Take 5 mg by mouth every 8 (eight) hours as needed for nausea.     QVAR REDIHALER 80 MCG/ACT inhaler Inhale 1 puff into the lungs 2 (two) times daily.      sertraline (ZOLOFT) 25 MG tablet TAKE 3 TABLETS BY MOUTH  (75MG ) EVERY MORNING (Patient taking  differently: Take 75 mg by mouth daily. ) 270 tablet 0   SUMAtriptan (IMITREX) 25 MG tablet Take 25 mg by mouth as needed for migraine.     acetaminophen (TYLENOL) 325 MG tablet Take 2 tablets (650 mg total) by mouth every 6 (six) hours as needed for mild pain. (Patient not taking: Reported on 01/14/2018) 30 tablet 0   cephALEXin (KEFLEX) 500 MG capsule Take 1 capsule (500 mg total) by mouth 2 (two) times daily. (Patient not taking:  Reported on 06/03/2018) 14 capsule 0   famotidine (PEPCID) 20 MG tablet Take 1 tablet (20 mg total) by mouth 2 (two) times daily. (Patient not taking: Reported on 01/14/2018) 30 tablet 0   ibuprofen (ADVIL,MOTRIN) 600 MG tablet Take 1 tablet (600 mg total) by mouth every 6 (six) hours as needed. (Patient not taking: Reported on 01/14/2018) 30 tablet 0   LORazepam (ATIVAN) 1 MG tablet Take 1 tablet (1 mg total) by mouth every 8 (eight) hours as needed for anxiety. (Patient not taking: Reported on 01/14/2018) 10 tablet 0   ondansetron (ZOFRAN) 4 MG tablet Take 1 tablet (4 mg total) by mouth every 6 (six) hours as needed for nausea. (Patient not taking: Reported on 01/14/2018) 10 tablet 0    Prior to Admission medications   Medication Sig Start Date End Date Taking? Authorizing Provider  albuterol (PROVENTIL HFA;VENTOLIN HFA) 108 (90 Base) MCG/ACT inhaler Inhale into the lungs every 6 (six) hours as needed for wheezing or shortness of breath.   Yes [provider]  ALPRAZolam Duanne Moron) 1 MG tablet Take 1 mg by mouth at bedtime as needed for anxiety.   Yes [provider]  Naphazoline HCl (CLEAR EYES OP) Place 1 drop into both eyes daily as needed (For dry eyes).   Yes [provider]  omeprazole (PRILOSEC) 20 MG capsule Take 20 mg by mouth daily. 08/08/17  Yes [provider]  Probiotic Product (PROBIOTIC PO) Take 1 tablet by mouth daily.   Yes [provider]  prochlorperazine (COMPAZINE) 5 MG tablet Take 5 mg by mouth every 8 (eight) hours as needed for nausea. 04/15/18  Yes [provider]  QVAR REDIHALER 80 MCG/ACT inhaler Inhale 1 puff into the lungs 2 (two) times daily.  01/14/18  Yes [provider]  sertraline (ZOLOFT) 25 MG tablet TAKE 3 TABLETS BY MOUTH  (75MG ) EVERY MORNING Patient taking differently: Take 75 mg by mouth daily.  06/24/18  Yes Delight Hoh, MD  SUMAtriptan (IMITREX) 25 MG tablet Take 25 mg by mouth as needed  for migraine. 07/17/17  Yes [provider]  acetaminophen (TYLENOL) 325 MG tablet Take 2 tablets (650 mg total) by mouth every 6 (six) hours as needed for mild pain. Patient not taking: Reported on 01/14/2018 04/21/13   Isaac Bliss, MD  cephALEXin (KEFLEX) 500 MG capsule Take 1 capsule (500 mg total) by mouth 2 (two) times daily. Patient not taking: Reported on 06/03/2018 01/15/18   Malvin Johns, MD  famotidine (PEPCID) 20 MG tablet Take 1 tablet (20 mg total) by mouth 2 (two) times daily. Patient not taking: Reported on 01/14/2018 07/11/15   Jean Rosenthal, NP  ibuprofen (ADVIL,MOTRIN) 600 MG tablet Take 1 tablet (600 mg total) by mouth every 6 (six) hours as needed. Patient not taking: Reported on 01/14/2018 09/19/15   Kristen Cardinal, NP  LORazepam (ATIVAN) 1 MG tablet Take 1 tablet (1 mg total) by mouth every 8 (eight) hours as needed for anxiety. Patient not taking:  Reported on 01/14/2018 07/11/15   Jean Rosenthal, NP  ondansetron (ZOFRAN) 4 MG tablet Take 1 tablet (4 mg total) by mouth every 6 (six) hours as needed for nausea. Patient not taking: Reported on 01/14/2018 09/19/15   Kristen Cardinal, NP  pantoprazole (PROTONIX) 40 MG tablet Take 1 tablet (40 mg total) by mouth 2 (two) times daily for 14 days. 08/11/19 08/25/19  Charlynne Cousins, MD    Blood pressure 124/84, pulse 93, temperature 98.3 F (36.8 C), temperature source Oral, resp. rate 18, height 5\' 4"  (1.626 m), weight 56.7 kg, SpO2 99 %. Physical Exam: General: tearful, WD/WN white female who is laying in bed in NAD HEENT: head is normocephalic, atraumatic.  Sclera are noninjected.  PERRL.  Ears and nose without any masses or lesions.  Mouth is pink and moist. Dentition fair Heart: regular, rate, and rhythm.  Normal s1,s2. No obvious murmurs, gallops, or rubs noted.  Palpable pedal pulses bilaterally  Lungs: CTAB, no wheezes, rhonchi, or rales noted.  Respiratory effort nonlabored Abd: very tender lower abdomen  R>L but abdomen is very soft, no peritonitis, ND, +BS, no masses, hernias, or organomegaly MS: no BUE/BLE edema, calves soft and nontender Skin: warm and dry with no masses, lesions, or rashes Psych: A&Ox4, appears anxious Neuro: cranial nerves grossly intact, equal strength in BUE/BLE bilaterally, normal speech, thought process intact  Results for orders placed or performed during the hospital encounter of 08/09/19 (from the past 48 hour(s))  Comprehensive metabolic panel     Status: Abnormal   Collection Time: 08/14/19  7:59 AM  Result Value Ref Range   Sodium 138 135 - 145 mmol/L   Potassium 3.9 3.5 - 5.1 mmol/L   Chloride 112 (H) 98 - 111 mmol/L   CO2 20 (L) 22 - 32 mmol/L   Glucose, Bld 90 70 - 99 mg/dL    Comment: Glucose reference range applies only to samples taken after fasting for at least 8 hours.   BUN <5 (L) 6 - 20 mg/dL   Creatinine, Ser 0.62 0.44 - 1.00 mg/dL   Calcium 8.3 (L) 8.9 - 10.3 mg/dL   Total Protein 4.9 (L) 6.5 - 8.1 g/dL   Albumin 2.9 (L) 3.5 - 5.0 g/dL   AST 15 15 - 41 U/L   ALT 18 0 - 44 U/L   Alkaline Phosphatase 45 38 - 126 U/L   Total Bilirubin 0.5 0.3 - 1.2 mg/dL   GFR calc non Af Amer >60 >60 mL/min   GFR calc Af Amer >60 >60 mL/min   Anion gap 6 5 - 15    Comment: Performed at [facility], [place] [address], [place], [place] [address]  Magnesium     Status: None   Collection Time: 08/14/19  7:59 AM  Result Value Ref Range   Magnesium 2.0 1.7 - 2.4 mg/dL    Comment: Performed at [facility] [address] [place], [place] [address]  Phosphorus     Status: None   Collection Time: 08/14/19  7:59 AM  Result Value Ref Range   Phosphorus 4.5 2.5 - 4.6 mg/dL    Comment: Performed at [facility] [address], [place], [place] [address]  CBC with Differential/Platelet     Status: Abnormal   Collection Time: 08/14/19  7:59 AM  Result Value Ref Range   WBC 6.0 4.0 - 10.5 K/uL   RBC 3.77 (L) 3.87 - 5.11 MIL/uL   Hemoglobin 12.1  12.0 -  15.0 g/dL   HCT 35.3 (L) 36 - 46 %   MCV 93.6 80.0 - 100.0 fL   MCH 32.1 26.0 - 34.0 pg   MCHC 34.3 30.0 - 36.0 g/dL   RDW 11.9 11.5 - 15.5 %   Platelets 174 150 - 400 K/uL   nRBC 0.0 0.0 - 0.2 %   Neutrophils Relative % 47 %   Neutro Abs 2.9 1.7 - 7.7 K/uL   Lymphocytes Relative 40 %   Lymphs Abs 2.4 0.7 - 4.0 K/uL   Monocytes Relative 9 %   Monocytes Absolute 0.5 0 - 1 K/uL   Eosinophils Relative 3 %   Eosinophils Absolute 0.2 0 - 0 K/uL   Basophils Relative 1 %   Basophils Absolute 0.0 0 - 0 K/uL   Immature Granulocytes 0 %   Abs Immature Granulocytes 0.01 0.00 - 0.07 K/uL    Comment: Performed at [facility] [address] [place], [place] [address]  C-reactive protein     Status: None   Collection Time: 08/14/19 12:47 PM  Result Value Ref Range   CRP 0.5 <1.0 mg/dL    Comment: Performed at [facility], [place] [address], [place], [place] [address]  Sedimentation rate     Status: None   Collection Time: 08/14/19 12:47 PM  Result Value Ref Range   Sed Rate 4 0 - 22 mm/hr    Comment: Performed at [facility], [place] [address] [place], [place] [address]  Comprehensive metabolic panel     Status: Abnormal   Collection Time: 08/15/19  4:37 AM  Result Value Ref Range   Sodium 139 135 - 145 mmol/L   Potassium 3.8 3.5 - 5.1 mmol/L   Chloride 108 98 - 111 mmol/L   CO2 21 (L) 22 - 32 mmol/L   Glucose, Bld 82 70 - 99 mg/dL    Comment: Glucose reference range applies only to samples taken after fasting for at least 8 hours.   BUN <5 (L) 6 - 20 mg/dL   Creatinine, Ser 0.76 0.44 - 1.00 mg/dL   Calcium 8.4 (L) 8.9 - 10.3 mg/dL   Total Protein 5.4 (L) 6.5 - 8.1 g/dL   Albumin 2.9 (L) 3.5 - 5.0 g/dL   AST 18 15 - 41 U/L   ALT 22 0 - 44 U/L   Alkaline Phosphatase 48 38 - 126 U/L   Total Bilirubin 0.9 0.3 - 1.2 mg/dL   GFR calc non Af Amer >60 >60 mL/min   GFR calc Af Amer >60 >60 mL/min   Anion gap 10 5 - 15    Comment: Performed at [facility], [place] [address] [place], [place] [address]  Magnesium     Status: None   Collection Time: 08/15/19  4:37 AM  Result Value Ref Range   Magnesium 1.9 1.7 - 2.4 mg/dL    Comment: Performed at [facility] [address] [place], [place] [address]  Phosphorus     Status: None   Collection Time: 08/15/19  4:37 AM  Result Value Ref Range   Phosphorus 4.4 2.5 - 4.6 mg/dL    Comment: Performed at [facility] [address], [place],  [address]  CBC with Differential/Platelet     Status: Abnormal   Collection Time: 08/15/19  4:37 AM  Result Value Ref Range   WBC 6.3 4.0 - 10.5 K/uL   RBC 3.75 (L) 3.87 - 5.11 MIL/uL  Hemoglobin 12.2 12.0 - 15.0 g/dL   HCT 35.3 (L) 36 - 46 %   MCV 94.1 80.0 - 100.0 fL   MCH 32.5 26.0 - 34.0 pg   MCHC 34.6 30.0 - 36.0 g/dL   RDW 11.7 11.5 - 15.5 %   Platelets 193 150 - 400 K/uL   nRBC 0.0 0.0 - 0.2 %   Neutrophils Relative % 54 %   Neutro Abs 3.5 1.7 - 7.7 K/uL   Lymphocytes Relative 33 %   Lymphs Abs 2.1 0.7 - 4.0 K/uL   Monocytes Relative 10 %   Monocytes Absolute 0.6 0 - 1 K/uL   Eosinophils Relative 2 %   Eosinophils Absolute 0.1 0 - 0 K/uL   Basophils Relative 1 %   Basophils Absolute 0.0 0 - 0 K/uL   Immature Granulocytes 0 %   Abs Immature Granulocytes 0.01 0.00 - 0.07 K/uL    Comment: Performed at [facility] [address], [place], [place] [address]   CT ABDOMEN PELVIS W CONTRAST  Result Date: 08/14/2019 CLINICAL DATA:  Worsening right lower quadrant abdominal pain. EXAM: CT ABDOMEN AND PELVIS WITH CONTRAST TECHNIQUE: Multidetector CT imaging of the abdomen and pelvis was performed using the standard protocol following bolus administration of intravenous contrast. CONTRAST:  121mL OMNIPAQUE IOHEXOL 300 MG/ML  SOLN COMPARISON:  Pelvic ultrasound yesterday. Abdominal CT 08/11/2019 FINDINGS: Lower chest: Lung bases are clear. Hepatobiliary: No focal hepatic abnormality. There is high-density material in the  gallbladder not seen on prior exam. This may represent vicarious excretion of IV contrast. No calcified gallstone or pericholecystic inflammation. Pancreas: No ductal dilatation or inflammation. Spleen: Tiny subcapsular hypodensity in the anterior spleen, unchanged. Adrenals/Urinary Tract: Normal adrenal glands. No hydronephrosis. There is early excretion of contrast within the renal collecting systems. No perinephric edema. Urinary bladder is physiologically distended. No bladder wall thickening. Stomach/Bowel: Bowel evaluation is limited in the absence of enteric contrast and paucity of intra-abdominal fat. Minimal edema in the pericolonic fat adjacent to the ascending colon, series 3, image 45, not seen on prior exam. No colonic wall thickening. Portions of normal appendix are visualized, series 3, image 78. There is no appendicitis. No terminal ileal inflammation. Decreased stool burden from prior. Vascular/Lymphatic: Normal caliber abdominal aorta. Patent portal vein. No bulky abdominopelvic adenopathy. Reproductive: IUD appropriately position in the uterus. Ovaries are symmetric. Mildly prominent adnexal and periuterine vascularity. In seen mild prominence of the left ovarian vein. Other: Trace free fluid in the pelvis. No free air or focal abscess. Musculoskeletal: There are no acute or suspicious osseous abnormalities. Hemi transitional lumbosacral anatomy. IMPRESSION: 1. Normal appendix. 2. Minimal edema in the pericolonic fat adjacent to the ascending colon, not seen on prior exam. In the setting of right-sided pain this may represent epiploic appendagitis or omental infarct. 3. Similar findings second be seen with pelvic congestion syndrome from recent exam. Electronically Signed   By: Keith Rake M.D.   On: 08/14/2019 19:05   DG ABD ACUTE 2+V W 1V CHEST  Result Date: 08/13/2019 CLINICAL DATA:  Pain with constipation EXAM: DG ABDOMEN ACUTE W/ 1V CHEST COMPARISON:  CT abdomen and pelvis August 11, 2019; chest radiograph August 09, 2019 FINDINGS: PA chest: Lungs are clear. Heart size and pulmonary vascularity are normal. No adenopathy. Supine and upright abdomen: There is moderate stool in the colon. There is no bowel dilatation or air-fluid level to suggest bowel obstruction. No free air. Intrauterine device in mid pelvis. IMPRESSION: No bowel obstruction  or free air. Intrauterine device in mid pelvis. Lungs clear. Electronically Signed   By: Lowella Grip III M.D.   On: 08/13/2019 14:58   US PELVIC COMPLETE WITH TRANSVAGINAL  Result Date: 08/13/2019 CLINICAL DATA:  Pelvic congestion syndrome. EXAM: TRANSABDOMINAL AND TRANSVAGINAL ULTRASOUND OF PELVIS TECHNIQUE: Both transabdominal and transvaginal ultrasound examinations of the pelvis were performed. Transabdominal technique was performed for global imaging of the pelvis including uterus, ovaries, adnexal regions, and pelvic cul-de-sac. It was necessary to proceed with endovaginal exam following the transabdominal exam to visualize the ovaries and adnexa. COMPARISON:  Abdominal CT 08/11/2019, pelvic ultrasound report 01/14/2018, images not available. FINDINGS: Uterus Measurements: 7.1 x 2.8 x 5.1 cm = volume: 54 mL. No fibroids or other mass visualized. Endometrium Thickness: 3 mm. Linear echogenic IUD appropriately position in the endometrium. No focal abnormality visualized. Right ovary Measurements: 3.8 x 1.9 x 1.8 cm = volume: 6.8 mL. Normal appearance with physiologic follicles. No adnexal mass. Ovarian blood flow is noted. Left ovary Measurements: 3.9 x 2.2 x 2.2 cm = volume: 9.5 mL. Normal appearance with physiologic follicles. No adnexal mass. Ovarian blood flow is noted. Other findings No abnormal free fluid. IMPRESSION: 1. Unremarkable pelvic ultrasound. 2. IUD normally position in the endometrium. Electronically Signed   By: Keith Rake M.D.   On: 08/13/2019 18:53      Assessment/Plan Asthma Depression  Acute on chronic abdominal  pain, IBS - General surgery asked to see due to patient's unrelenting symptoms and the question of epiploic appendagitis or omental infarct on CT scan. Scan was reviewed with Dr. Dema Severin and the edema in the pericolonic fat adjacent to the ascending colon is minimal. She has no signs of bowel perforation or anything that warrants acute surgical intervention on imaging. She is very tender on exam but abdomen is soft/flat, her WBC is WNL, and her vital signs are stable. I do not think that surgery would improve her symptoms, it may only make her worse. Recommend discussing again with GI if there is anything acute to be done for her IBS.  General surgery will sign off, please call with questions or concerns.   Wellington Hampshire, [facility] 08/15/2019, 10:59 AM Please see Amion for pager number during day hours 7:00am-4:30pm

## 2019-08-15 NOTE — Progress Notes (Signed)
[facility] Gastroenterology Progress Note  ILANA PREZIOSO 19 y.o. 2000/04/01   Subjective: Feels significantly better today compared with yesterday stating that the Tramadol and Advil combination has helped a lot. Tolerating full liquids. Mother at bedside. Nurse present.  Objective: Vital signs: Vitals:   08/15/19 0917 08/15/19 1218  BP: 124/84 119/70  Pulse: 93 (!) 107  Resp: 18 16  Temp:  97.8 F (36.6 C)  SpO2:  99%    Physical Exam: Gen: alert, no acute distress, thin  HEENT: anicteric sclera CV: RRR Chest: CTA B Abd: less tender with minimal guarding in suprapubic area, nondistended, +BS Ext: no edema  Lab Results: Recent Labs    08/14/19 0759 08/15/19 0437  NA 138 139  K 3.9 3.8  CL 112* 108  CO2 20* 21*  GLUCOSE 90 82  BUN <5* <5*  CREATININE 0.62 0.76  CALCIUM 8.3* 8.4*  MG 2.0 1.9  PHOS 4.5 4.4   Recent Labs    08/14/19 0759 08/15/19 0437  AST 15 18  ALT 18 22  ALKPHOS 45 48  BILITOT 0.5 0.9  PROT 4.9* 5.4*  ALBUMIN 2.9* 2.9*   Recent Labs    08/14/19 0759 08/15/19 0437  WBC 6.0 6.3  NEUTROABS 2.9 3.5  HGB 12.1 12.2  HCT 35.3* 35.3*  MCV 93.6 94.1  PLT 174 193      Assessment/Plan: Abdominal pain due to irritable bowel syndrome. Repeat CT showed minimal edema in the right colon seen by surgery who see no indication for surgery. Reassured pt again and would use Dicyclomine prn with laxatives prn. Fiber supplement ok but cautioned on risk of bloating and abdominal pain from fiber loading. Short course of Tramadol prn and NSAIDs prn since that combination is working. Cautioned on risk of NSAIDs. Psychiatry consult noted. Soft diet. F/U with Dr. Paulita Fujita in 4-6 weeks. Will sign off. Call if questions.   Lear Ng 08/15/2019, 2:40 PM  Questions please call [phone] ID: [name], female   DOB: 08-Jul-2000, 19 y.o.   MRN: [id number]

## 2019-08-15 NOTE — Progress Notes (Signed)
Physical Therapy Treatment Patient Details Name: Candace King MRN: [id number] DOB: December 17, 2000 Today's Date: 08/15/2019    History of Present Illness Pt is a 19 y.o. female presenting with SOB x2 days w/ sore throat and fever. Pt. found to be tachycardic in ED. PMHx significant for asthma, PE, telangiectasia, and depression.     PT Comments    Pt making excellent progress overall with functional mobility. She tolerated hallway ambulation with use of RW and progressing from min guard to supervision level for safety. Of note, pt's HR increasing to as high as 160 bpm with activity but quickly decreasing back to 120 bpm with sitting rest break. Pt's RN aware. Pt stating that she feels she is getting stronger each day and expressed readiness to return home. Pt would continue to benefit from skilled physical therapy services at this time while admitted and after d/c to address the below listed limitations in order to improve overall safety and independence with functional mobility.    Follow Up Recommendations  Home health PT;Supervision/Assistance - 24 hour     Equipment Recommendations  3in1 (PT);Rolling walker with 5" wheels    Recommendations for Other Services       Precautions / Restrictions Precautions Precautions: None Precaution Comments: monitor HR Restrictions Weight Bearing Restrictions: No    Mobility  Bed Mobility Overal bed mobility: Needs Assistance Bed Mobility: Supine to Sit;Sit to Supine     Supine to sit: Supervision Sit to supine: Supervision   General bed mobility comments: no physical assistance needed  Transfers Overall transfer level: Needs assistance Equipment used: None Transfers: Sit to/from Stand Sit to Stand: Min guard         General transfer comment: min guard for safety with initial transition into standing  Ambulation/Gait Ambulation/Gait assistance: Min guard;Supervision Gait Distance (Feet): 75 Feet Assistive device: Rolling  walker (2 wheeled) Gait Pattern/deviations: Step-to pattern;Decreased step length - right;Decreased step length - left;Decreased stride length;Shuffle Gait velocity: greatly decreased   General Gait Details: pt with very slow, cautious and guarded gait; no instability or LOB, min guard for safety and progressing to supervision   Stairs             Wheelchair Mobility    Modified Rankin (Stroke Patients Only)       Balance Overall balance assessment: Needs assistance Sitting-balance support: Feet supported Sitting balance-Leahy Scale: Good     Standing balance support: During functional activity;No upper extremity supported Standing balance-Leahy Scale: Fair                              Cognition Arousal/Alertness: Awake/alert Behavior During Therapy: WFL for tasks assessed/performed;Flat affect Overall Cognitive Status: Within Functional Limits for tasks assessed                                        Exercises      General Comments        Pertinent Vitals/Pain Pain Assessment: Faces Faces Pain Scale: Hurts a little bit Pain Location: RLQ of abdomen Pain Descriptors / Indicators: Guarding Pain Intervention(s): Monitored during session;Repositioned    Home Living                      Prior Function            PT Goals (current goals can now  be found in the care plan section) Acute Rehab PT Goals PT Goal Formulation: With patient/family Time For Goal Achievement: 08/28/19 Potential to Achieve Goals: Good Progress towards PT goals: Progressing toward goals    Frequency    Min 3X/week      PT Plan Current plan remains appropriate    Co-evaluation              AM-PAC PT "6 Clicks" Mobility   Outcome Measure  Help needed turning from your back to your side while in a flat bed without using bedrails?: None Help needed moving from lying on your back to sitting on the side of a flat bed without using  bedrails?: None Help needed moving to and from a bed to a chair (including a wheelchair)?: A Little Help needed standing up from a chair using your arms (e.g., wheelchair or bedside chair)?: None Help needed to walk in hospital room?: A Little Help needed climbing 3-5 steps with a railing? : A Little 6 Click Score: 21    End of Session Equipment Utilized During Treatment: Gait belt Activity Tolerance: Patient tolerated treatment well Patient left: in bed;with call bell/phone within reach;with family/visitor present Nurse Communication: Mobility status PT Visit Diagnosis: Other abnormalities of gait and mobility (R26.89)     Time: 1438-8875 PT Time Calculation (min) (ACUTE ONLY): 35 min  Charges:  $Gait Training: 8-22 mins $Therapeutic Activity: 8-22 mins                     Anastasio Champion, DPT  Acute Rehabilitation Services Pager [phone] Office [place], [place] 08/15/2019, 1:18 PM

## 2019-08-16 LAB — CBC WITH DIFFERENTIAL/PLATELET
Abs Immature Granulocytes: 0.03 10*3/uL (ref 0.00–0.07)
Basophils Absolute: 0 10*3/uL (ref 0.0–0.1)
Basophils Relative: 0 %
Eosinophils Absolute: 0.1 10*3/uL (ref 0.0–0.5)
Eosinophils Relative: 1 %
HCT: 33.6 % — ABNORMAL LOW (ref 36.0–46.0)
Hemoglobin: 11.5 g/dL — ABNORMAL LOW (ref 12.0–15.0)
Immature Granulocytes: 0 %
Lymphocytes Relative: 25 %
Lymphs Abs: 2 10*3/uL (ref 0.7–4.0)
MCH: 32.2 pg (ref 26.0–34.0)
MCHC: 34.2 g/dL (ref 30.0–36.0)
MCV: 94.1 fL (ref 80.0–100.0)
Monocytes Absolute: 0.6 10*3/uL (ref 0.1–1.0)
Monocytes Relative: 7 %
Neutro Abs: 5.4 10*3/uL (ref 1.7–7.7)
Neutrophils Relative %: 67 %
Platelets: 173 10*3/uL (ref 150–400)
RBC: 3.57 MIL/uL — ABNORMAL LOW (ref 3.87–5.11)
RDW: 11.6 % (ref 11.5–15.5)
WBC: 8.1 10*3/uL (ref 4.0–10.5)
nRBC: 0 % (ref 0.0–0.2)

## 2019-08-16 LAB — COMPREHENSIVE METABOLIC PANEL WITH GFR
ALT: 23 U/L (ref 0–44)
AST: 18 U/L (ref 15–41)
Albumin: 2.8 g/dL — ABNORMAL LOW (ref 3.5–5.0)
Alkaline Phosphatase: 45 U/L (ref 38–126)
Anion gap: 7 (ref 5–15)
BUN: <5 mg/dL — ABNORMAL LOW (ref 6–20)
CO2: 22 mmol/L (ref 22–32)
Calcium: 8.2 mg/dL — ABNORMAL LOW (ref 8.9–10.3)
Chloride: 110 mmol/L (ref 98–111)
Creatinine, Ser: 0.56 mg/dL (ref 0.44–1.00)
GFR calc Af Amer: >60 mL/min
GFR calc non Af Amer: >60 mL/min
Glucose, Bld: 136 mg/dL — ABNORMAL HIGH (ref 70–99)
Potassium: 3.3 mmol/L — ABNORMAL LOW (ref 3.5–5.1)
Sodium: 139 mmol/L (ref 135–145)
Total Bilirubin: 0.4 mg/dL (ref 0.3–1.2)
Total Protein: 5.1 g/dL — ABNORMAL LOW (ref 6.5–8.1)

## 2019-08-16 LAB — MAGNESIUM: Magnesium: 1.8 mg/dL (ref 1.7–2.4)

## 2019-08-16 LAB — PHOSPHORUS: Phosphorus: 3.9 mg/dL (ref 2.5–4.6)

## 2019-08-16 MED ORDER — HYDROCORTISONE ACETATE 25 MG RE SUPP: 25.0000 mg | Freq: Two times a day (BID) | RECTAL | 0 refills | Status: DC

## 2019-08-16 MED ORDER — DICYCLOMINE HCL 10 MG PO CAPS: 10.0000 mg | ORAL_CAPSULE | Freq: Four times a day (QID) | ORAL | 0 refills | Status: DC | PRN

## 2019-08-16 MED ORDER — MAGNESIUM SULFATE 2 GM/50ML IV SOLN
2.0000 g | Freq: Once | INTRAVENOUS | Status: AC
  Administered 2019-08-16: 2 g via INTRAVENOUS
  Filled 2019-08-16: qty 50

## 2019-08-16 MED ORDER — HYDROCORTISONE ACETATE 25 MG RE SUPP
25.0000 mg | Freq: Two times a day (BID) | RECTAL | Status: DC
  Administered 2019-08-16: 25 mg via RECTAL
  Filled 2019-08-16: qty 1

## 2019-08-16 MED ORDER — ACETAMINOPHEN 325 MG PO TABS
650.0000 mg | ORAL_TABLET | Freq: Four times a day (QID) | ORAL | 0 refills | Status: DC | PRN
Start: 2019-08-16 — End: 2020-07-20

## 2019-08-16 MED ORDER — ONDANSETRON HCL 4 MG PO TABS
4.0000 mg | ORAL_TABLET | Freq: Four times a day (QID) | ORAL | 0 refills | Status: DC | PRN
Start: 2019-08-16 — End: 2020-07-20

## 2019-08-16 MED ORDER — POTASSIUM CHLORIDE CRYS ER 20 MEQ PO TBCR
50.0000 meq | EXTENDED_RELEASE_TABLET | Freq: Once | ORAL | Status: AC
  Administered 2019-08-16: 50 meq via ORAL
  Filled 2019-08-16: qty 2

## 2019-08-16 MED ORDER — TRAMADOL HCL 50 MG PO TABS: 50.0000 mg | ORAL_TABLET | Freq: Three times a day (TID) | ORAL | 0 refills | Status: DC

## 2019-08-16 MED ORDER — PANTOPRAZOLE SODIUM 40 MG PO TBEC: 40.0000 mg | DELAYED_RELEASE_TABLET | Freq: Two times a day (BID) | ORAL | 0 refills | Status: AC

## 2019-08-16 MED ORDER — POLYETHYLENE GLYCOL 3350 17 G PO PACK: 17.0000 g | PACK | Freq: Every day | ORAL | 0 refills | Status: AC | PRN

## 2019-08-16 MED ORDER — IBUPROFEN 600 MG PO TABS: 600.0000 mg | ORAL_TABLET | Freq: Three times a day (TID) | ORAL | 0 refills | Status: DC

## 2019-08-16 NOTE — Discharge Summary (Signed)
Physician Discharge Summary  Candace King NOB:[id number] DOB: 2000/12/29 DOA: 08/09/2019  PCP: Judithann Sauger, MD  Admit date: 08/09/2019 Discharge date: 08/16/2019  Time spent: 30 minutes  Recommendations for Outpatient Follow-up: Abdominal pain/constipation -7/5 urine tox screen negative -7/5 UDS negative -7/7 SMOG enema + 1/2 bottle of magnesium citrate.    Patient continues to have pain consulted Dr. Nolen Mu GI who was on-call.   -TSH WNL -7/8 consulted GI; recommended the following.             -7/8 Bentyl 10 mg QID PRN             -MiraLAX 17 g daily PRN              -Follow-up with Dr. Arta Silence or Dr. Nolen Mu GI  -7/9 multidisciplinary conference held between GI, myself, and RN Havy. Again GI reiterated pain most likely due to irritable bowel syndrome exacerbated by constipation. Colonoscopy currently not indicated. If repeat abdominal/pelvis CT negative discharge patient home. (See [facility] note 7/9) -7/9 schedule follow-up 2 weeks with Dr. Arta Silence GI possible EGD/colonoscopy as an outpatient, IBS mixed type -RN Ave Filter called [facility] attempted to schedule appointment with Dr. Arta Silence, however office stated they would rather call patient directly to schedule appointment. This information was communicated to mother and patient -7/11 although patient stated Haldol was most effective in controlling her pain however made her sleepy, and had episodes of chills, and gave her blurry vision therefore does not want to be discharged on this medication..  Pain control -Tylenol 650 mg q6hr PRN -Bentyl 10 mg QID PRN -Motrin 600 mg TID -Tramadol 50 mg TID  Pelvic Congestion syndrome  Discussed case with patient's OB/GYN Dr. Philis Pique at [facility] who reviewed CT scan was unimpressed (does not believe causing abdominal pain) but recommended obtaining pelvic ultrasound.  -Transvaginal ultrasound unremarkable see results below -Schedule  follow-up appointment in 1 to 2 weeks with Dr. Jerelyn Charles at [facility] called [facility] and attempted to obtain follow-up appointment with Dr. Carlis Abbott, office stated they would rather call patient directly to schedule follow-up appointment. This information was communicated to mother and patient  Epiploic appendagitis -7/9 repeat CT scan shows possible epiploic appendagitis or omental infarct see results below -Normally managed conservatively; p.o.  anti-inflammatory medications, and if needed a short course of opioids  for four to seven days Usually do not require hospitalization or antibiotics  -Most severe cases in patients whose symptoms fail to improve with conservative management, those with new or worsening symptoms surgical ligation and resection of inflamed appendage  -7/11 [facility] Dr. Nadeen Landau evaluated patient.  Stated I do not see anything at this juncture that would warrant surgical intervention or be improved with surgery  Hemorrhoids -Hydrocortisone suppository  High anion gap metabolic acidosis -Resolved  Asthma -Currently not an issue  Depression/Panic Disorder  -Xanax 1 mg PRN -Zoloft 75 mg daily -7/9 consult to psychiatry.  Dr.Mojeed Clayton Bibles at length with patient and recommended continuing current medication at discharge.  Did recommend that patient see her own psychiatrist and possibly discontinue Zoloft, as this could be making her abdominal pain worse.    Migraine -Home regimen  Prochlorperazine  Hypokalemia -K-Dur 50 mEq prior to discharge  Hypomagnesmia -Magnesium IV 2 g prior to discharge   Discharge Diagnoses:  Principal Problem:   Tachycardia Active Problems:   Asthma   Depression   Abdominal pain  Pelvic congestion syndrome   Epiploic appendagitis   Discharge Condition: Stable  Diet recommendation: Regular  Filed Weights   08/09/19 2106  Weight: 56.7 kg    History of present illness:   Candace Green Masonis a 19 y.o.WF PMHx Asthma and depression, IBD (seen by Dr. Paulita Fujita GI)  Presents to the ER after patient became acutely nauseated with dizziness and short of breath after visiting church yesterday around 11 AM. Patient states that she had taken the bread which usually is dipped in grape juice during the holy communion but looks like yesterday it was dipped in the wine and she had taken it for the first time. 5 minutes later patient became nauseated started feeling dizzy not feeling well and became short of breath. At home she also had a temperature for which she took Advil. Patient also took 1 dose of prochlorperazine for nausea at home. Since patient symptoms persisted patient came to the ER.  ED Course:In the ER patient easily becomes tachycardic on movement. EKG shows sinus tachycardia with QTC of 456 ms and QRS of 101 ms. Labs show negative D-dimer with potassium of 3.1. Anion gap is 18 bicarb is 14. Patient states her oral medications including sertraline, as needed Xanax Qvar and albuterol are all has been taking for many years. Denies taking any over-the-counter medications. Patient was given IV Solu-Medrol for possible asthma exacerbation with nebulizer and also prochlorperazine for nausea. Patient was started on fluids potassium replacement admitted for further observation given the persistent tachycardia  Hospital Course:  -See above -This a.m. patient states the new pain regimen has worked extremely well, and her abdominal pain has resolved.  States upon discharge would prefer not to have the Haldol but the remainder of the pain regimen works fine.  In addition her hemorrhoids are flaring secondary to multiple days of diarrhea as her constipation resolved.  Also stated mild blurry vision secondary to taking the Haldol.   Procedures: 7/6 CT abdomen pelvis W contrast; negative acute abnormality -Moderate colonic stool burden possible constipation. -Prominent  left adnexal vascularity with minimal reflux contrast in dilatation of the ovarian vein can be seen with pelvic congestion syndrome in the appropriate clinical setting 7/8 US pelvic complete with transvaginal; Unremarkable pelvic ultrasound-IUD in normal position 7/9 CT abdomen pelvis W contrast;-normal appendix. -Minimal edema in the pericolonic fat adjacent to the ascending colon, not seen on prior exam. In the setting of right-sided pain this may represent epiploic appendagitis or omental infarct. -Similar findings second be seen with pelvic congestion syndrome from recent exam.  Consultations: GI Dr. Wilford [facility] Dr.Horvath at [facility] Dr.Mojeed Akintayo [facility] Dr. Nadeen Landau   Cultures  7/4 SARS coronavirus negative 7/4 group A strep by PCR negative 7/5 HIV negative     Discharge Exam: Vitals:   08/15/19 1943 08/15/19 2310 08/16/19 0610 08/16/19 0846  BP:  123/86 104/63   Pulse: 98     Resp: 18 18 18    Temp:  (!) 97.5 F (36.4 C) 97.8 F (36.6 C)   TempSrc:  Oral Oral   SpO2: 97% 100% 97% 98%  Weight:      Height:        General: A/O x4, no acute respiratory distress Eyes: negative scleral hemorrhage, negative anisocoria, negative icterus ENT: Negative Runny nose, negative gingival bleeding, Neck:  Negative scars, masses, torticollis, lymphadenopathy, JVD Lungs: Clear to auscultation bilaterally without wheezes or crackles Cardiovascular: Regular rate and rhythm without murmur gallop or rub normal S1  and S2 Abdomen:  Negative abdominal pain, nondistended, positive soft, bowel sounds, no rebound, no ascites, no appreciable mass   Discharge Instructions  Discharge Instructions    Diet - low sodium heart healthy   Complete by: As directed    Increase activity slowly   Complete by: As directed      Allergies as of 08/16/2019      Reactions   Augmentin [amoxicillin-pot Clavulanate] Diarrhea   Morphine And Related    Metal  taste      Medication List    STOP taking these medications   cephALEXin 500 MG capsule Commonly known as: KEFLEX   famotidine 20 MG tablet Commonly known as: Pepcid   LORazepam 1 MG tablet Commonly known as: Ativan   omeprazole 20 MG capsule Commonly known as: PRILOSEC     TAKE these medications   acetaminophen 325 MG tablet Commonly known as: Tylenol Take 2 tablets (650 mg total) by mouth every 6 (six) hours as needed for mild pain.   albuterol 108 (90 Base) MCG/ACT inhaler Commonly known as: VENTOLIN HFA Inhale into the lungs every 6 (six) hours as needed for wheezing or shortness of breath.   ALPRAZolam 1 MG tablet Commonly known as: XANAX Take 1 mg by mouth at bedtime as needed for anxiety.   CLEAR EYES OP Place 1 drop into both eyes daily as needed (For dry eyes).   dicyclomine 10 MG capsule Commonly known as: BENTYL Take 1 capsule (10 mg total) by mouth 4 (four) times daily as needed for spasms.   hydrocortisone 25 MG suppository Commonly known as: ANUSOL-HC Place 1 suppository (25 mg total) rectally 2 (two) times daily.   ibuprofen 600 MG tablet Commonly known as: ADVIL Take 1 tablet (600 mg total) by mouth every 6 (six) hours as needed. What changed: Another medication with the same name was added. Make sure you understand how and when to take each.   ibuprofen 600 MG tablet Commonly known as: ADVIL Take 1 tablet (600 mg total) by mouth 3 (three) times daily with meals. What changed: You were already taking a medication with the same name, and this prescription was added. Make sure you understand how and when to take each.   ondansetron 4 MG tablet Commonly known as: ZOFRAN Take 1 tablet (4 mg total) by mouth every 6 (six) hours as needed for nausea.   pantoprazole 40 MG tablet Commonly known as: PROTONIX Take 1 tablet (40 mg total) by mouth 2 (two) times daily.   polyethylene glycol 17 g packet Commonly known as: MIRALAX / GLYCOLAX Take 17 g by  mouth daily as needed for mild constipation.   PROBIOTIC PO Take 1 tablet by mouth daily.   prochlorperazine 5 MG tablet Commonly known as: COMPAZINE Take 5 mg by mouth every 8 (eight) hours as needed for nausea.   Qvar RediHaler 80 MCG/ACT inhaler Generic drug: beclomethasone Inhale 1 puff into the lungs 2 (two) times daily.   sertraline 25 MG tablet Commonly known as: ZOLOFT TAKE 3 TABLETS BY MOUTH  (75MG ) EVERY MORNING What changed: See the new instructions.   SUMAtriptan 25 MG tablet Commonly known as: IMITREX Take 25 mg by mouth as needed for migraine.   traMADol 50 MG tablet Commonly known as: ULTRAM Take 1 tablet (50 mg total) by mouth 3 (three) times daily.      Allergies  Allergen Reactions  . Augmentin [Amoxicillin-Pot Clavulanate] Diarrhea  . Morphine And Related     Metal taste  Follow-up Information    [facility]. Schedule an appointment as soon as possible for a visit in 2 week(s).   Why: Schedule follow-up appointment in 1 to 2 weeks with Dr. Jerelyn Charles at [facility] information: [place] [address] [phone]        Arta Silence, MD Follow up in 2 day(s).   Specialty: Gastroenterology Why: schedule follow-up 2 weeks with Dr. Arta Silence GI possible EGD/colonoscopy as an outpatient, IBS mixed type  Contact information: [address] [place] [address] [phone]        Jerelyn Charles, MD .   Specialty: Obstetrics Contact information: [place] [address] [phone]        Modena Nunnery, MD Follow up.   Specialty: Family Medicine Why: for functional medicine consult. Holistic approach to improving gut health. Contact information: [place] [address] [phone]                The results of significant diagnostics from this hospitalization (including imaging, microbiology, ancillary  and laboratory) are listed below for reference.    Significant Diagnostic Studies: CT ABDOMEN PELVIS W CONTRAST  Result Date: 08/14/2019 CLINICAL DATA:  Worsening right lower quadrant abdominal pain. EXAM: CT ABDOMEN AND PELVIS WITH CONTRAST TECHNIQUE: Multidetector CT imaging of the abdomen and pelvis was performed using the standard protocol following bolus administration of intravenous contrast. CONTRAST:  161mL OMNIPAQUE IOHEXOL 300 MG/ML  SOLN COMPARISON:  Pelvic ultrasound yesterday. Abdominal CT 08/11/2019 FINDINGS: Lower chest: Lung bases are clear. Hepatobiliary: No focal hepatic abnormality. There is high-density material in the gallbladder not seen on prior exam. This may represent vicarious excretion of IV contrast. No calcified gallstone or pericholecystic inflammation. Pancreas: No ductal dilatation or inflammation. Spleen: Tiny subcapsular hypodensity in the anterior spleen, unchanged. Adrenals/Urinary Tract: Normal adrenal glands. No hydronephrosis. There is early excretion of contrast within the renal collecting systems. No perinephric edema. Urinary bladder is physiologically distended. No bladder wall thickening. Stomach/Bowel: Bowel evaluation is limited in the absence of enteric contrast and paucity of intra-abdominal fat. Minimal edema in the pericolonic fat adjacent to the ascending colon, series 3, image 45, not seen on prior exam. No colonic wall thickening. Portions of normal appendix are visualized, series 3, image 78. There is no appendicitis. No terminal ileal inflammation. Decreased stool burden from prior. Vascular/Lymphatic: Normal caliber abdominal aorta. Patent portal vein. No bulky abdominopelvic adenopathy. Reproductive: IUD appropriately position in the uterus. Ovaries are symmetric. Mildly prominent adnexal and periuterine vascularity. In seen mild prominence of the left ovarian vein. Other: Trace free fluid in the pelvis. No free air or focal abscess. Musculoskeletal: There  are no acute or suspicious osseous abnormalities. Hemi transitional lumbosacral anatomy. IMPRESSION: 1. Normal appendix. 2. Minimal edema in the pericolonic fat adjacent to the ascending colon, not seen on prior exam. In the setting of right-sided pain this may represent epiploic appendagitis or omental infarct. 3. Similar findings second be seen with pelvic congestion syndrome from recent exam. Electronically Signed   By: Keith Rake M.D.   On: 08/14/2019 19:05   CT ABDOMEN PELVIS W CONTRAST  Result Date: 08/11/2019 CLINICAL DATA:  Abdominal pain and cramping. EXAM: CT ABDOMEN AND PELVIS WITH CONTRAST TECHNIQUE: Multidetector CT imaging of the abdomen and pelvis was performed using the standard protocol following bolus administration of intravenous contrast. CONTRAST:  125mL OMNIPAQUE IOHEXOL 300 MG/ML  SOLN  COMPARISON:  Abdominal CT and pelvic ultrasound 01/14/2018. FINDINGS: Lower chest: The lung bases are clear. Hepatobiliary: No focal liver abnormality is seen. No gallstones, gallbladder wall thickening, or biliary dilatation. Pancreas: No ductal dilatation or inflammation. Spleen: 5 mm hypodensity in the superior spleen is nonspecific but typically benign. No splenomegaly. Adrenals/Urinary Tract: Normal adrenal glands. No hydronephrosis or perinephric edema. Homogeneous renal enhancement. Urinary bladder is physiologically distended without wall thickening. Stomach/Bowel: Stomach is decompressed, unremarkable. Normal positioning of the duodenum and ligament of Treitz. There is no small bowel obstruction or inflammatory change. Administered enteric contrast reaches the distal colon. Appendix is air and contrast filled and normal. Moderate volume of stool throughout the colon. There is mild colonic tortuosity. No colonic wall thickening. Vascular/Lymphatic: Normal caliber abdominal aorta. Patent portal vein. Circumaortic left renal vein. Minimal reflux of contrast and dilatation of the gonadal vein, 5 mm  on the left. No adenopathy. Reproductive: IUD appropriately position in the uterus which is unremarkable. Physiologic appearance of the ovaries. Slight increased left adnexal and periuterine vascularity. Other: Tiny fat containing umbilical hernia. No free air, free fluid, or intra-abdominal fluid collection. Musculoskeletal: There are no acute or suspicious osseous abnormalities. Hemi transitional lumbosacral anatomy. IMPRESSION: 1. No acute abnormality in the abdomen/pelvis. 2. Moderate colonic stool burden with colonic tortuosity, possible constipation. 3. Prominent left adnexal vascularity with minimal reflux contrast in dilatation of the ovarian vein can be seen with pelvic congestion syndrome in the appropriate clinical setting. Electronically Signed   By: Keith Rake M.D.   On: 08/11/2019 23:52   DG Chest Portable 1 View  Result Date: 08/09/2019 CLINICAL DATA:  Shortness of breath for 2 days, worsening today now with sore throat and fever EXAM: PORTABLE CHEST 1 VIEW COMPARISON:  Radiograph 06/03/2018 FINDINGS: No consolidation, features of edema, pneumothorax, or effusion. Pulmonary vascularity is normally distributed. The cardiomediastinal contours are unremarkable. No acute osseous or soft tissue abnormality. Telemetry leads and mask wire project over the chest and base of the neck. IMPRESSION: No acute cardiopulmonary abnormality. Electronically Signed   By: Lovena Le M.D.   On: 08/09/2019 21:27   DG ABD ACUTE 2+V W 1V CHEST  Result Date: 08/13/2019 CLINICAL DATA:  Pain with constipation EXAM: DG ABDOMEN ACUTE W/ 1V CHEST COMPARISON:  CT abdomen and pelvis August 11, 2019; chest radiograph August 09, 2019 FINDINGS: PA chest: Lungs are clear. Heart size and pulmonary vascularity are normal. No adenopathy. Supine and upright abdomen: There is moderate stool in the colon. There is no bowel dilatation or air-fluid level to suggest bowel obstruction. No free air. Intrauterine device in mid pelvis.  IMPRESSION: No bowel obstruction or free air. Intrauterine device in mid pelvis. Lungs clear. Electronically Signed   By: Lowella Grip III M.D.   On: 08/13/2019 14:58   US PELVIC COMPLETE WITH TRANSVAGINAL  Result Date: 08/13/2019 CLINICAL DATA:  Pelvic congestion syndrome. EXAM: TRANSABDOMINAL AND TRANSVAGINAL ULTRASOUND OF PELVIS TECHNIQUE: Both transabdominal and transvaginal ultrasound examinations of the pelvis were performed. Transabdominal technique was performed for global imaging of the pelvis including uterus, ovaries, adnexal regions, and pelvic cul-de-sac. It was necessary to proceed with endovaginal exam following the transabdominal exam to visualize the ovaries and adnexa. COMPARISON:  Abdominal CT 08/11/2019, pelvic ultrasound report 01/14/2018, images not available. FINDINGS: Uterus Measurements: 7.1 x 2.8 x 5.1 cm = volume: 54 mL. No fibroids or other mass visualized. Endometrium Thickness: 3 mm. Linear echogenic IUD appropriately position in the endometrium. No focal abnormality visualized. Right ovary Measurements:  3.8 x 1.9 x 1.8 cm = volume: 6.8 mL. Normal appearance with physiologic follicles. No adnexal mass. Ovarian blood flow is noted. Left ovary Measurements: 3.9 x 2.2 x 2.2 cm = volume: 9.5 mL. Normal appearance with physiologic follicles. No adnexal mass. Ovarian blood flow is noted. Other findings No abnormal free fluid. IMPRESSION: 1. Unremarkable pelvic ultrasound. 2. IUD normally position in the endometrium. Electronically Signed   By: Keith Rake M.D.   On: 08/13/2019 18:53    Microbiology: Recent Results (from the past 240 hour(s))  SARS Coronavirus 2 by RT PCR (hospital order, performed in [facility] hospital lab) Nasopharyngeal Throat     Status: None   Collection Time: 08/09/19  9:17 PM   Specimen: Throat; Nasopharyngeal  Result Value Ref Range Status   SARS Coronavirus 2 NEGATIVE NEGATIVE Final    Comment: (NOTE) SARS-CoV-2 target nucleic acids are NOT  DETECTED.  The SARS-CoV-2 RNA is generally detectable in upper and lower respiratory specimens during the acute phase of infection. The lowest concentration of SARS-CoV-2 viral copies this assay can detect is 250 copies / mL. A negative result does not preclude SARS-CoV-2 infection and should not be used as the sole basis for treatment or other patient management decisions.  A negative result may occur with improper specimen collection / handling, submission of specimen other than nasopharyngeal swab, presence of viral mutation(s) within the areas targeted by this assay, and inadequate number of viral copies (<250 copies / mL). A negative result must be combined with clinical observations, patient history, and epidemiological information.  Fact Sheet for Patients:   StrictlyIdeas.no  Fact Sheet for Healthcare Providers: BankingDealers.co.za  This test is not yet approved or  cleared by the [place] FDA and has been authorized for detection and/or diagnosis of SARS-CoV-2 by FDA under an Emergency Use Authorization (EUA).  This EUA will remain in effect (meaning this test can be used) for the duration of the COVID-19 declaration under Section 564(b)(1) of the Act, 21 U.S.C. section 360bbb-3(b)(1), unless the authorization is terminated or revoked sooner.  Performed at [facility], [place] [address] [place], [place] [address]   Group A Strep by PCR     Status: None   Collection Time: 08/09/19  9:17 PM   Specimen: Throat; Sterile Swab  Result Value Ref Range Status   Group A Strep by PCR NOT DETECTED NOT DETECTED Final    Comment: Performed at [facility], [place] [address], [place], [place] [address]     Labs: Basic Metabolic Panel: Recent Labs  Lab 08/12/19 1351 08/13/19 0257 08/14/19 0759 08/15/19 0437 08/16/19 0218  NA 140 140 138 139 139  K 4.1 3.9 3.9 3.8 3.3*  CL 109 109 112* 108 110  CO2 22 23 20* 21*  22  GLUCOSE 89 90 90 82 136*  BUN 6 7 <5* <5* <5*  CREATININE 0.88 0.88 0.62 0.76 0.56  CALCIUM 9.0 8.5* 8.3* 8.4* 8.2*  MG 2.0 2.8* 2.0 1.9 1.8  PHOS 5.5* 4.9* 4.5 4.4 3.9   Liver Function Tests: Recent Labs  Lab 08/12/19 1351 08/13/19 0257 08/14/19 0759 08/15/19 0437 08/16/19 0218  AST 18 17 15 18 18   ALT 19 18 18 22 23   ALKPHOS 56 56 45 48 45  BILITOT 0.9 0.8 0.5 0.9 0.4  PROT 6.5 5.9* 4.9* 5.4* 5.1*  ALBUMIN 3.8 3.5 2.9* 2.9* 2.8*   No results for input(s): LIPASE, AMYLASE in the last 168 hours. No results for  input(s): AMMONIA in the last 168 hours. CBC: Recent Labs  Lab 08/11/19 1747 08/13/19 0257 08/14/19 0759 08/15/19 0437 08/16/19 0218  WBC 6.6 7.1 6.0 6.3 8.1  NEUTROABS 4.0 3.7 2.9 3.5 5.4  HGB 14.2 14.3 12.1 12.2 11.5*  HCT 40.2 41.1 35.3* 35.3* 33.6*  MCV 93.1 93.4 93.6 94.1 94.1  PLT 219 229 174 193 173   Cardiac Enzymes: Recent Labs  Lab 08/10/19 0500  CKTOTAL 54   BNP: BNP (last 3 results) No results for input(s): BNP in the last 8760 hours.  ProBNP (last 3 results) No results for input(s): PROBNP in the last 8760 hours.  CBG: No results for input(s): GLUCAP in the last 168 hours.     Signed:  Dia Crawford, MD [facility] [phone] pager

## 2019-08-16 NOTE — Progress Notes (Signed)
Nsg Discharge Note  Patient to be discharged home. Rolling walker delivered to room. Mother to provide transportation. Questions and concerns answered from both patient and mother during discharge teaching.  Admit Date:  08/09/2019 Discharge date: 08/16/2019   [name] to be D/C'd Home per MD order.  AVS completed.  Copy for chart, and copy for patient signed, and dated. Patient/caregiver able to verbalize understanding.  Discharge Medication: Allergies as of 08/16/2019      Reactions   Augmentin [amoxicillin-pot Clavulanate] Diarrhea   Morphine And Related    Metal taste      Medication List    STOP taking these medications   cephALEXin 500 MG capsule Commonly known as: KEFLEX   famotidine 20 MG tablet Commonly known as: Pepcid   LORazepam 1 MG tablet Commonly known as: Ativan   omeprazole 20 MG capsule Commonly known as: PRILOSEC     TAKE these medications   acetaminophen 325 MG tablet Commonly known as: Tylenol Take 2 tablets (650 mg total) by mouth every 6 (six) hours as needed for mild pain.   albuterol 108 (90 Base) MCG/ACT inhaler Commonly known as: VENTOLIN HFA Inhale into the lungs every 6 (six) hours as needed for wheezing or shortness of breath.   ALPRAZolam 1 MG tablet Commonly known as: XANAX Take 1 mg by mouth at bedtime as needed for anxiety.   CLEAR EYES OP Place 1 drop into both eyes daily as needed (For dry eyes).   dicyclomine 10 MG capsule Commonly known as: BENTYL Take 1 capsule (10 mg total) by mouth 4 (four) times daily as needed for spasms.   hydrocortisone 25 MG suppository Commonly known as: ANUSOL-HC Place 1 suppository (25 mg total) rectally 2 (two) times daily.   ibuprofen 600 MG tablet Commonly known as: ADVIL Take 1 tablet (600 mg total) by mouth every 6 (six) hours as needed. What changed: Another medication with the same name was added. Make sure you understand how and when to take each.   ibuprofen 600 MG  tablet Commonly known as: ADVIL Take 1 tablet (600 mg total) by mouth 3 (three) times daily with meals. What changed: You were already taking a medication with the same name, and this prescription was added. Make sure you understand how and when to take each.   ondansetron 4 MG tablet Commonly known as: ZOFRAN Take 1 tablet (4 mg total) by mouth every 6 (six) hours as needed for nausea.   pantoprazole 40 MG tablet Commonly known as: PROTONIX Take 1 tablet (40 mg total) by mouth 2 (two) times daily.   polyethylene glycol 17 g packet Commonly known as: MIRALAX / GLYCOLAX Take 17 g by mouth daily as needed for mild constipation.   PROBIOTIC PO Take 1 tablet by mouth daily.   prochlorperazine 5 MG tablet Commonly known as: COMPAZINE Take 5 mg by mouth every 8 (eight) hours as needed for nausea.   Qvar RediHaler 80 MCG/ACT inhaler Generic drug: beclomethasone Inhale 1 puff into the lungs 2 (two) times daily.   sertraline 25 MG tablet Commonly known as: ZOLOFT TAKE 3 TABLETS BY MOUTH  (75MG ) EVERY MORNING What changed: See the new instructions.   SUMAtriptan 25 MG tablet Commonly known as: IMITREX Take 25 mg by mouth as needed for migraine.   traMADol 50 MG tablet Commonly known as: ULTRAM Take 1 tablet (50 mg total) by mouth 3 (three) times daily.            Durable Medical  Equipment  (From admission, onward)         Start     Ordered   08/16/19 1014  For home use only DME Walker rolling  Once       Question Answer Comment  Walker: With 5 Inch Wheels   Patient needs a walker to treat with the following condition Weakness      08/16/19 1014          Discharge Assessment: Vitals:   08/16/19 0846 08/16/19 0950  BP:  106/76  Pulse:  85  Resp:  18  Temp:  98 F (36.7 C)  SpO2: 98% 99%   Skin clean, dry and intact without evidence of skin break down, no evidence of skin tears noted. IV catheter discontinued intact. Site without signs and symptoms of  complications - no redness or edema noted at insertion site, patient denies c/o pain - only slight tenderness at site.  Dressing with slight pressure applied.  D/c Instructions-Education: Discharge instructions given to patient/family with verbalized understanding. D/c education completed with patient/family including follow up instructions, medication list, d/c activities limitations if indicated, with other d/c instructions as indicated by MD - patient able to verbalize understanding, all questions fully answered. Patient instructed to return to ED, call 911, or call MD for any changes in condition.  Patient escorted via [place], and D/C home via private auto.  Erasmo Leventhal, RN 08/16/2019 11:18 AM

## 2019-09-04 ENCOUNTER — Other Ambulatory Visit: Payer: Self-pay | Admitting: Psychiatry

## 2019-09-04 NOTE — Telephone Encounter (Signed)
I assume this is not a current patient? No epic notes

## 2019-11-05 ENCOUNTER — Telehealth: Payer: Self-pay

## 2019-11-05 NOTE — Telephone Encounter (Signed)
NOTES ON FILE FROM [facility] [phone], SENT REFRRAL TO [facility]

## 2019-11-25 ENCOUNTER — Encounter: Payer: Self-pay | Admitting: Psychiatry

## 2019-12-22 ENCOUNTER — Encounter: Payer: Self-pay | Admitting: Cardiology

## 2019-12-22 ENCOUNTER — Telehealth: Payer: Self-pay | Admitting: Radiology

## 2019-12-22 ENCOUNTER — Ambulatory Visit (INDEPENDENT_AMBULATORY_CARE_PROVIDER_SITE_OTHER): Payer: Medicaid Other | Admitting: Cardiology

## 2019-12-22 ENCOUNTER — Other Ambulatory Visit: Payer: Self-pay

## 2019-12-22 VITALS — BP 100/68 | HR 98 | Ht 64.0 in | Wt 118.0 lb

## 2019-12-22 DIAGNOSIS — R55 Syncope and collapse: Secondary | ICD-10-CM

## 2019-12-22 DIAGNOSIS — R002 Palpitations: Secondary | ICD-10-CM

## 2019-12-22 NOTE — Progress Notes (Signed)
Electrophysiology Office Note   Date:  12/22/2019   ID:  Candace King, DOB [birth year], MRN [id number]  PCP:  Heywood Bene, PA-C  Cardiologist:   Primary Electrophysiologist:  Ralf Konopka [name] Leeds, MD    Chief Complaint: palpitations   History of Present Illness: Candace King is a 19 y.o. female who is being seen today for the evaluation of palpitations at the request of Summer, Jennifer, MD. Presenting today for electrophysiology evaluation.  She has a very complex past medical history.  She had sleep apnea as a child and had her tonsils and adenoids removed.  She also has an auditory processing deficiency.  She has had multiple hospitalizations.  Patient presents complaining of lightheadedness.  At her last pediatric visit, the pediatrician felt that she may have POTS and wanted her to be evaluated for this.  She has lightheaded when she stands from a seated position.  She alternates from getting hot and cold very quickly.  When she feels hot, the only thing that helps is an ice pack.  She has had increased heart rates for a years on and off.  She had a syncopal episode April 2021 when she passed out in the shower.  She states that she felt shaky while she was in the shower and started to get hot when she blacked out.  She drinks plenty of water.  She also has fatigue and brain fog.  Today, she denies symptoms of palpitations, chest pain, shortness of breath, orthopnea, PND, lower extremity edema, claudication, dizziness, presyncope, syncope, bleeding, or neurologic sequela. The patient is tolerating medications without difficulties.  She did have an episode of syncope April 2021.  She is feels that this was due to the hot water in the shower.   Past Medical History:  Diagnosis Date  . Asthma   . Telangiectasia    Past Surgical History:  Procedure Laterality Date  . TONSILLECTOMY       Current Outpatient Medications  Medication Sig Dispense Refill  .  acetaminophen (TYLENOL) 325 MG tablet Take 2 tablets (650 mg total) by mouth every 6 (six) hours as needed for mild pain. 60 tablet 0  . albuterol (PROVENTIL HFA;VENTOLIN HFA) 108 (90 Base) MCG/ACT inhaler Inhale into the lungs every 6 (six) hours as needed for wheezing or shortness of breath.    . ALPRAZolam (XANAX) 1 MG tablet Take 1 mg by mouth at bedtime as needed for anxiety.    . busPIRone (BUSPAR) 10 MG tablet Take 2 tablets by mouth in the AM and 1 tablet by mouth in the PM    . dicyclomine (BENTYL) 10 MG capsule Take 1 capsule (10 mg total) by mouth 4 (four) times daily as needed for spasms. 120 capsule 0  . hydrocortisone (ANUSOL-HC) 25 MG suppository Place 1 suppository (25 mg total) rectally 2 (two) times daily. 36 suppository 0  . ibuprofen (ADVIL) 600 MG tablet Take 1 tablet (600 mg total) by mouth 3 (three) times daily with meals. 90 tablet 0  . Naphazoline HCl (CLEAR EYES OP) Place 1 drop into both eyes daily as needed (For dry eyes).    . ondansetron (ZOFRAN) 4 MG tablet Take 1 tablet (4 mg total) by mouth every 6 (six) hours as needed for nausea. 30 tablet 0  . polyethylene glycol (MIRALAX / GLYCOLAX) 17 g packet Take 17 g by mouth daily as needed for mild constipation. 14 each 0  . Probiotic Product (PROBIOTIC PO) Take 1 tablet by mouth daily.    Marland Kitchen  prochlorperazine (COMPAZINE) 5 MG tablet Take 5 mg by mouth every 8 (eight) hours as needed for nausea.    Marland Kitchen QVAR REDIHALER 80 MCG/ACT inhaler Inhale 1 puff into the lungs 2 (two) times daily.     . sertraline (ZOLOFT) 100 MG tablet Take 100 mg by mouth daily.    . SUMAtriptan (IMITREX) 25 MG tablet Take 25 mg by mouth as needed for migraine.    . traMADol (ULTRAM) 50 MG tablet Take 1 tablet (50 mg total) by mouth 3 (three) times daily. 90 tablet 0  . pantoprazole (PROTONIX) 40 MG tablet Take 1 tablet (40 mg total) by mouth 2 (two) times daily. 60 tablet 0   No current facility-administered medications for this visit.    Allergies:    Augmentin [amoxicillin-pot clavulanate], Gabapentin, Morphine and related, and Topiramate   Social History:  The patient  reports that she has never smoked. She has never used smokeless tobacco. She reports previous drug use. She reports that she does not drink alcohol.   Family History:  The patient's family history includes Anxiety disorder in her mother and paternal grandfather; Breast cancer in her paternal grandmother; Cancer in her maternal aunt, maternal uncle, and mother; Heart attack in her maternal grandmother; Hyperlipidemia in her maternal grandfather and maternal grandmother; Hypertension in her father and mother; Stroke in her maternal grandmother.    ROS:  Please see the history of present illness.   Otherwise, review of systems is positive for none.   All other systems are reviewed and negative.    PHYSICAL EXAM: VS:  BP 100/68   Pulse 98   Ht 5\' 4"  (1.626 m)   Wt 118 lb (53.5 kg)   LMP 12/14/2019   SpO2 100%   BMI 20.25 kg/m  , BMI Body mass index is 20.25 kg/m. GEN: Well nourished, well developed, in no acute distress  HEENT: normal  Neck: no JVD, carotid bruits, or masses Cardiac: RRR; no murmurs, rubs, or gallops,no edema  Respiratory:  clear to auscultation bilaterally, normal work of breathing GI: soft, nontender, nondistended, + BS MS: no deformity or atrophy  Skin: warm and dry. Neuro:  Strength and sensation are intact Psych: euthymic mood, full affect  EKG:  EKG is ordered today. Personal review of the ekg ordered shows sinus rhythm with PACs and short runs of atrial tachycardia.    Recent Labs: 08/10/2019: TSH 1.100 08/16/2019: ALT 23; BUN <5; Creatinine, Ser 0.56; Hemoglobin 11.5; Magnesium 1.8; Platelets 173; Potassium 3.3; Sodium 139    Lipid Panel  No results found for: CHOL, TRIG, HDL, CHOLHDL, VLDL, LDLCALC, LDLDIRECT   Wt Readings from Last 3 Encounters:  12/22/19 118 lb (53.5 kg)  08/09/19 125 lb (56.7 kg) (44 %, Z= -0.16)*  06/03/18 125  lb (56.7 kg) (48 %, Z= -0.04)*   * Growth percentiles are based on CDC (Girls, 2-20 Years) data.      Other studies Reviewed: Additional studies/ records that were reviewed today include: PCP notes   ASSESSMENT AND PLAN:  1.  Palpitations: At this point is difficult to tell the cause of her palpitations.  Her ECG today shows sinus rhythm with PACs.  This does not necessarily sound like POTS, but she did have an episode of syncope.  I Frankee Gritz have her wear a 2-week monitor to further determine if she is having any arrhythmias.    Current medicines are reviewed at length with the patient today.   The patient does not have concerns regarding her  medicines.  The following changes were made today:  none  Labs/ tests ordered today include:  Orders Placed This Encounter  Procedures  . LONG TERM MONITOR (3-14 DAYS)  . EKG 12-Lead     Disposition:   FU with [place] 6 weeks  Signed, Xandra Laramee [name] Leeds, MD  12/22/2019 9:39 AM     [facility] [address] [place] [address] [phone] (office) [phone] (fax)

## 2019-12-22 NOTE — Patient Instructions (Signed)
Medication Instructions:  Your physician recommends that you continue on your current medications as directed. Please refer to the Current Medication list given to you today.  *If you need a refill on your cardiac medications before your next appointment, please call your pharmacy*   Lab Work: None ordered   Testing/Procedures: Your physician has recommended that you wear a 14 DAY ZIO-PATCH monitor. The Zio patch cardiac monitor continuously records heart rhythm data, this is for patients being evaluated for multiple types heart rhythms. For the first 24 hours post application, please avoid getting the Zio monitor wet in the shower or by excessive sweating during exercise. After that, feel free to carry on with regular activities. Keep soaps and lotions away from the ZIO XT Patch.  Someone will be calling you to let you know when this is mailed.  This will be mailed to you, please expect 7-10 days to receive.      Call [employer] at [phone] if you have questions regarding your ZIO XT patch monitor.  Call them immediately if you see an orange light blinking on your monitor.   If your monitor falls off in less than 4 days contact our Monitor department at [phone].  If your monitor becomes loose or falls off after 4 days call [employer] at [phone] for suggestions on securing your monitor     Follow-Up: At [facility], you and your health needs are our priority.  As part of our continuing mission to provide you with exceptional heart care, we have created designated Provider Care Teams.  These Care Teams include your primary Cardiologist (physician) and Advanced Practice Providers (APPs -  Physician Assistants and Nurse Practitioners) who all work together to provide you with the care you need, when you need it.  Your next appointment:   6 week(s)  The format for your next appointment:   In Person  Provider:   Allegra Lai, MD    Thank you  for choosing [facility]!!   Trinidad Curet, RN [phone]   Other Instructions   ZIO XT- Long Term Monitor Instructions   Your physician has requested you wear your ZIO patch monitor_______days.   This is a single patch monitor.  [employer] supplies one patch monitor per enrollment.  Additional stickers are not available.   Please do not apply patch if you will be having a Nuclear Stress Test, Echocardiogram, Cardiac CT, MRI, or Chest Xray during the time frame you would be wearing the monitor. The patch cannot be worn during these tests.  You cannot remove and re-apply the ZIO XT patch monitor.   Your ZIO patch monitor will be sent USPS Priority mail from [facility] directly to your home address. The monitor may also be mailed to a PO BOX if home delivery is not available.   It may take 3-5 days to receive your monitor after you have been enrolled.   Once you have received you monitor, please review enclosed instructions.  Your monitor has already been registered assigning a specific monitor serial # to you.   Applying the monitor   Shave hair from upper left chest.   Hold abrader disc by orange tab.  Rub abrader in 40 strokes over left upper chest as indicated in your monitor instructions.   Clean area with 4 enclosed alcohol pads .  Use all pads to assure are is cleaned thoroughly.  Let dry.   Apply patch as indicated in monitor instructions.  Patch will be place  under collarbone on left side of chest with arrow pointing upward.   Rub patch adhesive wings for 2 minutes.Remove white label marked "1".  Remove white label marked "2".  Rub patch adhesive wings for 2 additional minutes.   While looking in a mirror, press and release button in center of patch.  A small green light will flash 3-4 times .  This will be your only indicator the monitor has been turned on.     Do not shower for the first 24 hours.  You may shower after the first 24 hours.   Press button if  you feel a symptom. You will hear a small click.  Record Date, Time and Symptom in the Patient Log Book.   When you are ready to remove patch, follow instructions on last 2 pages of Patient Log Book.  Stick patch monitor onto last page of Patient Log Book.   Place Patient Log Book in [place] box.  Use locking tab on box and tape box closed securely.  The [employer] has [employer] on it.  Please place in mailbox as soon as possible.  Your physician should have your test results approximately 7 days after the monitor has been mailed back to [facility].   Call [employer] at [phone] if you have questions regarding your ZIO XT patch monitor.  Call them immediately if you see an orange light blinking on your monitor.   If your monitor falls off in less than 4 days contact our Monitor department at [phone].  If your monitor becomes loose or falls off after 4 days call [employer] at [phone] for suggestions on securing your monitor.

## 2019-12-22 NOTE — Telephone Encounter (Signed)
Enrolled patient for a 14 day Zio XT  monitor to be mailed to patients home  °

## 2020-01-05 ENCOUNTER — Ambulatory Visit (INDEPENDENT_AMBULATORY_CARE_PROVIDER_SITE_OTHER): Payer: Medicaid Other

## 2020-01-05 DIAGNOSIS — R55 Syncope and collapse: Secondary | ICD-10-CM | POA: Diagnosis not present

## 2020-02-04 ENCOUNTER — Other Ambulatory Visit: Payer: Self-pay

## 2020-02-04 ENCOUNTER — Encounter: Payer: Self-pay | Admitting: Cardiology

## 2020-02-04 ENCOUNTER — Ambulatory Visit (INDEPENDENT_AMBULATORY_CARE_PROVIDER_SITE_OTHER): Payer: Medicaid Other | Admitting: Cardiology

## 2020-02-04 VITALS — BP 100/74 | HR 100 | Ht 64.0 in | Wt 116.2 lb

## 2020-02-04 DIAGNOSIS — R002 Palpitations: Secondary | ICD-10-CM | POA: Diagnosis not present

## 2020-02-04 NOTE — Patient Instructions (Addendum)
°  Medication Instructions:  Your physician recommends that you continue on your current medications as directed. Please refer to the Current Medication list given to you today.  *If you need a refill on your cardiac medications before your next appointment, please call your pharmacy*   Lab Work: None ordered   Testing/Procedures: None ordered   Follow-Up: At [facility], you and your health needs are our priority.  As part of our continuing mission to provide you with exceptional heart care, we have created designated Provider Care Teams.  These Care Teams include your primary Cardiologist (physician) and Advanced Practice Providers (APPs -  Physician Assistants and Nurse Practitioners) who all work together to provide you with the care you need, when you need it.  Your next appointment:    as needed  The format for your next appointment:   In Person  Provider:   Loman Brooklyn, MD    Thank you for choosing [facility]!!   Dory Horn, RN [phone]

## 2020-02-04 NOTE — Progress Notes (Signed)
Electrophysiology Office Note   Date:  02/04/2020   ID:  Manaya, Berteau 07/15/2000, MRN [id number]  PCP:  Heywood Bene, PA-C  Cardiologist:   Primary Electrophysiologist:  Mileena Rothenberger [name] Leeds, MD    Chief Complaint: palpitations   History of Present Illness: Candace King is a 19 y.o. female who is being seen today for the evaluation of palpitations at the request of Jimmye Norman, Breejante J, *. Presenting today for electrophysiology evaluation.  She has a complex past medical history.  She has sleep apnea as a child and had her tonsils and adenoids removed.  She has an auditory processing deficiency.  She has had multiple hospitalizations.  She presented complaining of lightheadedness.  At her pediatric visit, the pediatrician felt she may have POTS and wanted her evaluated for this.  She gets lightheaded when she stands from a seated position.  She alternates between getting hot and cold very quickly.  When she feels hot, the only thing that Fay Swider help is an ice pack.  She has had increased heart rates on and off.  In April 2021, she had an episode of syncope when she passed out in the shower.  She felt shaky while she was in the shower.  She started to get hot and blacked out.  She does drink plenty of water.  She has fatigue and brain fog.  She wore a cardiac monitor that showed no arrhythmias with triggered events associated with sinus rhythm and sinus tachycardia.  Today, denies symptoms of palpitations, chest pain, shortness of breath, orthopnea, PND, lower extremity edema, claudication, dizziness, presyncope, syncope, bleeding, or neurologic sequela. The patient is tolerating medications without difficulties.  Since last being seen she has done well.  She has continued to have similar symptoms of lightheadedness and intermittent chest discomfort.  These occur when she is anxious.  She is currently feeling well today.  Her cardiac monitor showed no evidence of arrhythmia  and symptoms were associated with sinus rhythm.   Past Medical History:  Diagnosis Date  . Asthma   . Telangiectasia    Past Surgical History:  Procedure Laterality Date  . TONSILLECTOMY       Current Outpatient Medications  Medication Sig Dispense Refill  . acetaminophen (TYLENOL) 325 MG tablet Take 2 tablets (650 mg total) by mouth every 6 (six) hours as needed for mild pain. 60 tablet 0  . albuterol (PROVENTIL HFA;VENTOLIN HFA) 108 (90 Base) MCG/ACT inhaler Inhale into the lungs every 6 (six) hours as needed for wheezing or shortness of breath.    . ALPRAZolam (XANAX) 1 MG tablet Take 1 mg by mouth at bedtime as needed for anxiety.    . busPIRone (BUSPAR) 10 MG tablet Take 2 tablets by mouth in the AM and 1 tablet by mouth in the PM    . ibuprofen (ADVIL) 600 MG tablet Take 1 tablet (600 mg total) by mouth 3 (three) times daily with meals. 90 tablet 0  . Naphazoline HCl (CLEAR EYES OP) Place 1 drop into both eyes daily as needed (For dry eyes).    . ondansetron (ZOFRAN) 4 MG tablet Take 1 tablet (4 mg total) by mouth every 6 (six) hours as needed for nausea. 30 tablet 0  . pantoprazole (PROTONIX) 40 MG tablet Take 1 tablet (40 mg total) by mouth 2 (two) times daily. 60 tablet 0  . polyethylene glycol (MIRALAX / GLYCOLAX) 17 g packet Take 17 g by mouth daily as needed for mild constipation. [place]  each 0  . Probiotic Product (PROBIOTIC PO) Take 1 tablet by mouth daily.    . prochlorperazine (COMPAZINE) 5 MG tablet Take 5 mg by mouth every 8 (eight) hours as needed for nausea.    Marland Kitchen QVAR REDIHALER 80 MCG/ACT inhaler Inhale 1 puff into the lungs 2 (two) times daily.     . SUMAtriptan (IMITREX) 50 MG tablet Take 1 tablet by mouth as needed.    Marland Kitchen tiZANidine (ZANAFLEX) 4 MG tablet Take 1 tablet by mouth as needed.     No current facility-administered medications for this visit.    Allergies:   Augmentin [amoxicillin-pot clavulanate], Gabapentin, Morphine and related, and Topiramate    Social History:  The patient  reports that she has never smoked. She has never used smokeless tobacco. She reports previous drug use. She reports that she does not drink alcohol.   Family History:  The patient's family history includes Anxiety disorder in her mother and paternal grandfather; Breast cancer in her paternal grandmother; Cancer in her maternal aunt, maternal uncle, and mother; Heart attack in her maternal grandmother; Hyperlipidemia in her maternal grandfather and maternal grandmother; Hypertension in her father and mother; Stroke in her maternal grandmother.   ROS:  Please see the history of present illness.   Otherwise, review of systems is positive for none.   All other systems are reviewed and negative.   PHYSICAL EXAM: VS:  BP 100/74   Pulse 100   Ht 5\' 4"  (1.626 m)   Wt 116 lb 3.2 oz (52.7 kg)   SpO2 99%   BMI 19.95 kg/m  , BMI Body mass index is 19.95 kg/m. GEN: Well nourished, well developed, in no acute distress  HEENT: normal  Neck: no JVD, carotid bruits, or masses Cardiac: RRR; no murmurs, rubs, or gallops,no edema  Respiratory:  clear to auscultation bilaterally, normal work of breathing GI: soft, nontender, nondistended, + BS MS: no deformity or atrophy  Skin: warm and dry Neuro:  Strength and sensation are intact Psych: euthymic mood, full affect  EKG:  EKG is not ordered today. Personal review of the ekg ordered 12/22/19 shows sinus 76   Recent Labs: 08/10/2019: TSH 1.100 08/16/2019: ALT 23; BUN <5; Creatinine, Ser 0.56; Hemoglobin 11.5; Magnesium 1.8; Platelets 173; Potassium 3.3; Sodium 139    Lipid Panel  No results found for: CHOL, TRIG, HDL, CHOLHDL, VLDL, LDLCALC, LDLDIRECT   Wt Readings from Last 3 Encounters:  02/04/20 116 lb 3.2 oz (52.7 kg)  12/22/19 118 lb (53.5 kg)  08/09/19 125 lb (56.7 kg) (44 %, Z= -0.16)*   * Growth percentiles are based on CDC (Girls, 2-20 Years) data.      Other studies Reviewed: Additional studies/  records that were reviewed today include: Cardiac monitor 01/28/2020 personally reviewed Max 155 bpm 08:58pm, 12/05 Min 42 bpm 10:03am, 12/13 Avg 80 bpm Less than 1% ventricular and supraventricular ectopy The predominant rhythm was sinus rhythm No arrhythmias noted Triggered events associated with sinus rhythm and sinus tachycardia   ASSESSMENT AND PLAN:  1.  Palpitations: Currently she feels well.  She continues to have palpitations and lightheadedness.  Her cardiac monitor showed no evidence of arrhythmia.  Her average heart rate was 80 bpm, and thus I do not feel that this is an inappropriate sinus tachycardia or POTS.  She feels well and is encouraged by no arrhythmia noted.  It is certainly possible that her symptoms could be complicated by her anxiety.  She Jaymason Ledesma continue to exercise.  I  Mendel Binsfeld see her back on an as-needed basis.    Current medicines are reviewed at length with the patient today.   The patient does not have concerns regarding her medicines.  The following changes were made today: None  Labs/ tests ordered today include:  No orders of the defined types were placed in this encounter.    Disposition:   FU with Einar Nolasco as needed weeks  Signed, Ulysess Witz Jorja Loa, MD  02/04/2020 8:39 AM     [facility] [address] [place] [address] [phone] (office) [phone] (fax)

## 2020-07-18 ENCOUNTER — Ambulatory Visit (HOSPITAL_COMMUNITY)
Admission: EM | Admit: 2020-07-18 | Discharge: 2020-07-18 | Disposition: A | Discharge status: Home / Self Care | Payer: Medicaid Other | Attending: Student | Admitting: Student

## 2020-07-18 ENCOUNTER — Other Ambulatory Visit: Payer: Self-pay

## 2020-07-18 DIAGNOSIS — F32A Depression, unspecified: Secondary | ICD-10-CM | POA: Insufficient documentation

## 2020-07-18 DIAGNOSIS — Z79899 Other long term (current) drug therapy: Secondary | ICD-10-CM | POA: Insufficient documentation

## 2020-07-18 DIAGNOSIS — F39 Unspecified mood [affective] disorder: Secondary | ICD-10-CM | POA: Insufficient documentation

## 2020-07-18 DIAGNOSIS — F419 Anxiety disorder, unspecified: Secondary | ICD-10-CM | POA: Insufficient documentation

## 2020-07-18 NOTE — BH Assessment (Signed)
Comprehensive Clinical Assessment (CCA) Note  07/19/2020 Candace King [id number]  Disposition Candace John, PA, patient does not meet inpatient criteria. Patient given outpatient resources. Patient will follow up with Candace King, Candace King for virtual teenage groups. Candace also given Crisis Line contact information if needed.   Candace patient demonstrates Candace following risk factors for suicide: Chronic risk factors for suicide include: N/A. Acute risk factors for suicide include: Candace or marital conflict and unemployment. Protective factors for this patient include: responsibility to others (children, Candace) and coping skills. Considering these factors, Candace overall suicide risk at this point appears to be moderate. Patient is appropriate for outpatient follow up.  Candace King is a 20 year old female presenting voluntary as a walk-in to Candace King accompanied by her mother Candace King. Patient consented for mother to be present during assessment. Patient denied SI, HI and psychosis. Patient denied having a current psychiatrist or therapist. Patient is currently taking medications prescribed by her PCP for anxiety. Mother reported onset of weird behaviors 1 month ago during sisters graduation ceremony. Patient became overheated, staring into space, taking awkward body pictures, aimously walking in different directions while at a gas station. Mother reported making strange statement like "my arm is your arm, my leg is your leg". Patient's mother states that patient's most prevalent stressors have been not being able to see her sister frequently since Candace King, patient's father/patient's mother's ex-husband leaving them 3 years ago, and patient's maternal grandfather dying 1 year ago. Patient resides with mother. Patient is unemployed. Patient contracts for safety.  Per chart review, patient presented to Candace King on 07/16/2020 for similar issues mentioned  above.  Per chart review, it appears that Candace patient was discharged from Candace emergency King on 07/17/2018 to be given outpatient psychiatric resources for Candace King.  Patient's mother states that they did go to Candace emergency King on 07/16/2020, but they were not able to follow up with any of Candace resources provided because Candace resources were in Cand[place] King and patient and her mother lives in [place].  Chart review also shows that patient was seen by psychology at Candace King, in which it was documented in this assessment that certain portions of Candace assessment were not able to be completed and it was determined that Candace patient's cognitive issues were more likely to be related to psychiatric reasons than neurological reasons.  Chief Complaint:  Chief Complaint  Patient presents with   Psychiatric Evaluation   Visit Diagnosis:  Unspecified Generalized Mood Disorder  CCA Screening, Triage and Referral (STR)  Patient Reported Information How did you hear about [place]? Candace/Friend  What Is Candace Reason for Your Visit/Call Today? Bizarre behaviors  How Long Has This Been Causing You Problems? 1 wk - 1 month  What Do You Feel Would Help You Candace Most Today? -- (Assistance with Central Processing disorder)  Have You Recently Had Any Thoughts About Hurting Yourself? No  Are You Planning to Commit Suicide/Harm Yourself At This time? No  Have you Recently Had Thoughts About [place]? No  Are You Planning to Harm Someone at This Time? No  Explanation: No data recorded  Have You Used Any Alcohol or Drugs in Candace Past 24 Hours? No  How Long Ago Did You Use Drugs or Alcohol? No data recorded What Did You Use and How Much? No data recorded  Do You Currently Have a Therapist/Psychiatrist? No  Name of Therapist/Psychiatrist: No data recorded  Have You Been Recently Discharged From Any Office Practice or Programs? No  Explanation of Discharge  From Practice/Program: No data recorded  CCA Screening Triage Referral Assessment Type of Contact: Face-to-Face  Telemedicine Service Delivery:   Is this Initial or Reassessment? No data recorded Date Telepsych consult ordered in [facility]:  No data recorded Time Telepsych consult ordered in [facility]:  No data recorded Location of Assessment: Candace King  Provider Location: No data recorded  Collateral Involvement: Candace King, mother, present with patients permisssion.  Does Patient Have a Stage manager Guardian? No data recorded Name and Contact of Legal Guardian: No data recorded If Minor and Not Living with Parent(s), Who has Custody? No data recorded Is CPS involved or ever been involved? Never  Is APS involved or ever been involved? Never  Patient Determined To Be At Risk for Harm To Self or Others Based on Review of Patient Reported Information or Presenting Complaint? No  Method: No data recorded Availability of Means: No data recorded Intent: No data recorded Notification Required: No data recorded Additional Information for Danger to Others Potential: No data recorded Additional Comments for Danger to Others Potential: No data recorded Are There Guns or Other Weapons in Your Home? No data recorded Types of Guns/Weapons: No data recorded Are These Weapons Safely Secured?                            No data recorded Who Could Verify You Are Able To Have These Secured: No data recorded Do You Have any Outstanding Charges, Pending Court Dates, Parole/Probation? No data recorded Contacted To Inform of Risk of Harm To Self or Others: No data recorded  Does Patient Present under Involuntary Commitment? No  IVC Papers Initial File Date: No data recorded  [place] of Residence: Candace King  Patient Currently Receiving Candace Following King: Not Receiving King  Determination of Need: Routine (7 days)  Options For Referral: Outpatient Therapy; Medication  Management  CCA Biopsychosocial Patient Reported Schizophrenia/Schizoaffective Diagnosis in Past: No  Strengths: uta  Mental Health Symptoms Depression:  No data recorded  Duration of Depressive symptoms:    Mania:   None   Anxiety:    Restlessness; Worrying   Psychosis:   None   Duration of Psychotic symptoms:    Trauma:   None   Obsessions:   None   Compulsions:   None   Inattention:   None   Hyperactivity/Impulsivity:   None   Oppositional/Defiant Behaviors:   None   Emotional Irregularity:   Transient, stress-related paranoia/disassociation   Other Mood/Personality Symptoms:  No data recorded   Mental Status Exam Appearance and self-care  Stature:   Average   Weight:   Average weight   Clothing:  No data recorded  Grooming:   Normal   Cosmetic use:   None   Posture/gait:   Normal   Motor activity:   Not Remarkable   Sensorium  Attention:   Normal   Concentration:   Normal   Orientation:   X5   Recall/memory:   Normal   Affect and Mood  Affect:   Flat   Mood:  No data recorded  Relating  Eye contact:   Staring   Facial expression:   Sad   Attitude toward examiner:   Cooperative   Thought and Language  Speech flow:  Slow; Soft   Thought content:   Appropriate to Mood and Circumstances   Preoccupation:  None   Hallucinations:   None   Organization:  No data recorded  [employer] of Knowledge:   Average   Intelligence:   Average   Abstraction:   Normal   Judgement:   Fair   Reality Testing:   Distorted   Insight:   Fair   Decision Making:   Normal   Social Functioning  Social Maturity:  No data recorded  Social Judgement:   Normal   Stress  Stressors:   -- Special educational needs teacher)   Coping Ability:   -- Pincus Badder)   Skill Deficits:   -- Pincus Badder)   Supports:   Candace    Religion: Religion/Spirituality Are You A Religious Person?:  Special educational needs teacher)  Leisure/Recreation: Leisure /  Recreation Do You Have Hobbies?: No  Exercise/Diet: Exercise/Diet Do You Exercise?: No Have You Gained or Lost A Significant Amount of Weight in Candace Past Six Months?: Yes-Lost Do You Follow a Special Diet?: No Do You Have Any Trouble Sleeping?: Yes Explanation of Sleeping Difficulties: Hard to stay asleep.  CCA Employment/Education Employment/Work Situation: Employment / Work Situation Employment Situation: Unemployed  Education: Education Is Patient Currently Attending School?: No Last Grade Completed: 45 Did You Nutritional therapist?: No Patient's Education Has Been Impacted by Current Illness: No  CCA Candace/Childhood History Candace and Relationship History: Candace history Does patient have children?: No  Childhood History:  Childhood History By whom was/is Candace patient raised?: Both parents Did patient suffer any verbal/emotional/physical/sexual abuse as a child?: No Did patient suffer from severe childhood neglect?: No Has patient ever been sexually abused/assaulted/raped as an adolescent or adult?: No Was Candace patient ever a victim of a crime or a disaster?: No  Child/Adolescent Assessment:   CCA Substance Use Alcohol/Drug Use: Alcohol / Drug Use Pain Medications: see MAR Prescriptions: see MAR Over Candace Counter: see MAR History of alcohol / drug use?: No history of alcohol / drug abuse   ASAM's:  Six Dimensions of Multidimensional Assessment  Dimension 1:  Acute Intoxication and/or Withdrawal Potential:      Dimension 2:  Biomedical Conditions and Complications:      Dimension 3:  Emotional, Behavioral, or Cognitive Conditions and Complications:     Dimension 4:  Readiness to Change:     Dimension 5:  Relapse, Continued use, or Continued Problem Potential:     Dimension 6:  Recovery/Living Environment:     ASAM Severity Score:    ASAM Recommended Level of Treatment:     Substance use Disorder (SUD)   Recommendations for  King/Supports/Treatments: Recommendations for King/Supports/Treatments Recommendations For King/Supports/Treatments: Medication Management, Individual Therapy  Discharge Disposition:   DSM5 Diagnoses: Patient Active Problem List   Diagnosis Date Noted   Epiploic appendagitis 08/14/2019   Pelvic congestion syndrome 08/13/2019   Abdominal pain 08/12/2019   Tachycardia 08/10/2019   Asthma 08/10/2019   Depression 08/10/2019   Moderate persistent asthma with exacerbation    Referrals to Alternative Service(s): Referred to Alternative Service(s):   Place:   Date:   Time:    Referred to Alternative Service(s):   Place:   Date:   Time:    Referred to Alternative Service(s):   Place:   Date:   Time:    Referred to Alternative Service(s):   Place:   Date:   Time:     Venora Maples, [facility]
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
## 2020-07-18 NOTE — Discharge Instructions (Addendum)
  Discharge recommendations:  Patient is to take medications as prescribed. Please see information for follow-up appointment with psychiatry and therapy. Please follow up with your primary care provider for all medical related needs.   Therapy: We recommend that patient participate in individual therapy to address mental health concerns.  Medications: The patient is to contact a medical professional and/or outpatient provider to address any new side effects that develop. Patient should update outpatient providers of any new medications and/or medication changes.   Safety:  The patient should abstain from use of illicit substances/drugs and abuse of any medications. If symptoms worsen or do not continue to improve or if the patient becomes actively suicidal or homicidal then it is recommended that the patient return to the closest hospital emergency department, the [facility], or call 911 for further evaluation and treatment. National Suicide Prevention Lifeline 1-800-SUICIDE or 905-427-3434.

## 2020-07-19 ENCOUNTER — Other Ambulatory Visit: Payer: Self-pay

## 2020-07-19 ENCOUNTER — Ambulatory Visit (HOSPITAL_COMMUNITY)
Admission: EM | Admit: 2020-07-19 | Discharge: 2020-07-20 | Disposition: A | Discharge status: Home / Self Care | Payer: Medicaid Other | Attending: Nurse Practitioner | Admitting: Nurse Practitioner

## 2020-07-19 DIAGNOSIS — F32A Depression, unspecified: Secondary | ICD-10-CM | POA: Insufficient documentation

## 2020-07-19 DIAGNOSIS — Z888 Allergy status to other drugs, medicaments and biological substances status: Secondary | ICD-10-CM | POA: Insufficient documentation

## 2020-07-19 DIAGNOSIS — Z20822 Contact with and (suspected) exposure to covid-19: Secondary | ICD-10-CM | POA: Insufficient documentation

## 2020-07-19 DIAGNOSIS — Z885 Allergy status to narcotic agent status: Secondary | ICD-10-CM | POA: Insufficient documentation

## 2020-07-19 DIAGNOSIS — H9325 Central auditory processing disorder: Secondary | ICD-10-CM | POA: Insufficient documentation

## 2020-07-19 DIAGNOSIS — Z79899 Other long term (current) drug therapy: Secondary | ICD-10-CM | POA: Insufficient documentation

## 2020-07-19 DIAGNOSIS — F411 Generalized anxiety disorder: Secondary | ICD-10-CM | POA: Insufficient documentation

## 2020-07-19 DIAGNOSIS — Z881 Allergy status to other antibiotic agents status: Secondary | ICD-10-CM | POA: Insufficient documentation

## 2020-07-19 DIAGNOSIS — F39 Unspecified mood [affective] disorder: Secondary | ICD-10-CM

## 2020-07-19 DIAGNOSIS — Z880 Allergy status to penicillin: Secondary | ICD-10-CM | POA: Insufficient documentation

## 2020-07-19 LAB — POC SARS CORONAVIRUS 2 AG: SARSCOV2ONAVIRUS 2 AG: NEGATIVE

## 2020-07-19 MED ORDER — ALPRAZOLAM 0.5 MG PO TABS
1.0000 mg | ORAL_TABLET | Freq: Every evening | ORAL | Status: DC | PRN
  Administered 2020-07-19: 1 mg via ORAL
  Filled 2020-07-19: qty 2

## 2020-07-19 MED ORDER — BECLOMETHASONE DIPROP HFA 80 MCG/ACT IN AERB
1.0000 | INHALATION_SPRAY | Freq: Two times a day (BID) | RESPIRATORY_TRACT | Status: DC
  Filled 2020-07-19: qty 10.6

## 2020-07-19 MED ORDER — ALBUTEROL SULFATE HFA 108 (90 BASE) MCG/ACT IN AERS: 2.0000 | INHALATION_SPRAY | Freq: Four times a day (QID) | RESPIRATORY_TRACT | Status: DC | PRN

## 2020-07-19 MED ORDER — ALUM & MAG HYDROXIDE-SIMETH 200-200-20 MG/5ML PO SUSP: 30.0000 mL | ORAL | Status: DC | PRN

## 2020-07-19 MED ORDER — MAGNESIUM HYDROXIDE 400 MG/5ML PO SUSP: 30.0000 mL | Freq: Every day | ORAL | Status: DC | PRN

## 2020-07-19 MED ORDER — ACETAMINOPHEN 325 MG PO TABS: 650.0000 mg | ORAL_TABLET | Freq: Four times a day (QID) | ORAL | Status: DC | PRN

## 2020-07-19 MED ORDER — SERTRALINE HCL 100 MG PO TABS
100.0000 mg | ORAL_TABLET | Freq: Every day | ORAL | Status: DC
  Administered 2020-07-19 – 2020-07-20 (×2): 100 mg via ORAL
  Filled 2020-07-19 (×2): qty 1

## 2020-07-19 NOTE — ED Notes (Signed)
Patient very labile during admission process. Patient tangential in conversation, but wanting to converse at length before consenting to COVID swab and blood work.  Mother also teary and emotional.  Patient ambulated independently to unit with MHT.  Oriented to unit and given food/fluids.  Mother took all belongings out of patient locker with her home.

## 2020-07-19 NOTE — [facility] Counselor (Signed)
Candace King is a 20 year old female presenting voluntary as a walk-in to [facility] accompanied by her mother Emmagrace Runkel. Patient consented for mother to be present during assessment. Patient denied SI, HI and psychosis. Patient denied having a current psychiatrist or therapist. Patient is currently taking medications prescribed by her PCP for anxiety. Mother reported onset of weird behaviors 1 month ago during sisters graduation ceremony. Patient became overheated, staring into space, taking awkward body pictures, aimously walking in different directions while at a gas station. Mother reported making strange statement like "my arm is your arm, my leg is your leg". Patient's mother states that patient's most prevalent stressors have been not being able to see her sister frequently since [place], patient's father/patient's mother's ex-husband leaving them 3 years ago, and patient's maternal grandfather dying 1 year ago. Patient resides with mother. Patient is unemployed. Patient contracts for safety.  Routine
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
## 2020-07-20 LAB — CBC WITH DIFFERENTIAL/PLATELET
Abs Immature Granulocytes: 0 10*3/uL (ref 0.00–0.07)
Basophils Absolute: 0 10*3/uL (ref 0.0–0.1)
Basophils Relative: 1 %
Eosinophils Absolute: 0 10*3/uL (ref 0.0–0.5)
Eosinophils Relative: 0 %
HCT: 41.6 % (ref 36.0–46.0)
Hemoglobin: 14.4 g/dL (ref 12.0–15.0)
Immature Granulocytes: 0 %
Lymphocytes Relative: 38 %
Lymphs Abs: 1.6 10*3/uL (ref 0.7–4.0)
MCH: 32.7 pg (ref 26.0–34.0)
MCHC: 34.6 g/dL (ref 30.0–36.0)
MCV: 94.3 fL (ref 80.0–100.0)
Monocytes Absolute: 0.3 10*3/uL (ref 0.1–1.0)
Monocytes Relative: 7 %
Neutro Abs: 2.4 10*3/uL (ref 1.7–7.7)
Neutrophils Relative %: 54 %
Platelets: 197 10*3/uL (ref 150–400)
RBC: 4.41 MIL/uL (ref 3.87–5.11)
RDW: 12 % (ref 11.5–15.5)
WBC: 4.3 10*3/uL (ref 4.0–10.5)
nRBC: 0 % (ref 0.0–0.2)

## 2020-07-20 LAB — RESP PANEL BY RT-PCR (FLU A&B, COVID) ARPGX2
Influenza A by PCR: NEGATIVE
Influenza B by PCR: NEGATIVE
SARS Coronavirus 2 by RT PCR: NEGATIVE

## 2020-07-20 LAB — POCT URINE DRUG SCREEN - MANUAL ENTRY (I-SCREEN)
POC Amphetamine UR: NOT DETECTED
POC Buprenorphine (BUP): NOT DETECTED
POC Cocaine UR: NOT DETECTED
POC Marijuana UR: NOT DETECTED
POC Methadone UR: NOT DETECTED
POC Methamphetamine UR: NOT DETECTED
POC Morphine: NOT DETECTED
POC Oxazepam (BZO): NOT DETECTED
POC Oxycodone UR: NOT DETECTED
POC Secobarbital (BAR): NOT DETECTED

## 2020-07-20 LAB — HEMOGLOBIN A1C
Hgb A1c MFr Bld: 4.7 % — ABNORMAL LOW (ref 4.8–5.6)
Mean Plasma Glucose: 88 mg/dL

## 2020-07-20 LAB — COMPREHENSIVE METABOLIC PANEL
ALT: 17 U/L (ref 0–44)
AST: 19 U/L (ref 15–41)
Albumin: 3.9 g/dL (ref 3.5–5.0)
Alkaline Phosphatase: 53 U/L (ref 38–126)
Anion gap: 9 (ref 5–15)
BUN: 10 mg/dL (ref 6–20)
CO2: 24 mmol/L (ref 22–32)
Calcium: 8.9 mg/dL (ref 8.9–10.3)
Chloride: 108 mmol/L (ref 98–111)
Creatinine, Ser: 0.78 mg/dL (ref 0.44–1.00)
GFR, Estimated: >60 mL/min (ref >60)
Glucose, Bld: 114 mg/dL — ABNORMAL HIGH (ref 70–99)
Potassium: 3.2 mmol/L — ABNORMAL LOW (ref 3.5–5.1)
Sodium: 141 mmol/L (ref 135–145)
Total Bilirubin: 0.7 mg/dL (ref 0.3–1.2)
Total Protein: 6.4 g/dL — ABNORMAL LOW (ref 6.5–8.1)

## 2020-07-20 LAB — LIPID PANEL
Cholesterol: 172 mg/dL (ref 0–200)
HDL: 47 mg/dL (ref >40)
LDL Cholesterol: 109 mg/dL — ABNORMAL HIGH (ref 0–99)
Total CHOL/HDL Ratio: 3.7 RATIO
Triglycerides: 79 mg/dL (ref <150)
VLDL: 16 mg/dL (ref 0–40)

## 2020-07-20 LAB — TSH: TSH: 0.833 u[IU]/mL (ref 0.350–4.500)

## 2020-07-20 LAB — POCT PREGNANCY, URINE: Preg Test, Ur: NEGATIVE

## 2020-07-20 MED ORDER — HALOPERIDOL 5 MG PO TABS: 5.0000 mg | ORAL_TABLET | Freq: Once | ORAL | Status: AC

## 2020-07-20 MED ORDER — DIPHENHYDRAMINE HCL 50 MG/ML IJ SOLN
50.0000 mg | Freq: Once | INTRAMUSCULAR | Status: AC
  Administered 2020-07-20: 50 mg via INTRAMUSCULAR
  Filled 2020-07-20: qty 1

## 2020-07-20 MED ORDER — HALOPERIDOL 5 MG PO TABS: 5.0000 mg | ORAL_TABLET | Freq: Three times a day (TID) | ORAL | Status: DC | PRN

## 2020-07-20 MED ORDER — DIPHENHYDRAMINE HCL 50 MG/ML IJ SOLN: 50.0000 mg | Freq: Three times a day (TID) | INTRAMUSCULAR | Status: DC | PRN

## 2020-07-20 MED ORDER — HALOPERIDOL LACTATE 5 MG/ML IJ SOLN: 5.0000 mg | Freq: Three times a day (TID) | INTRAMUSCULAR | Status: DC | PRN

## 2020-07-20 MED ORDER — HALOPERIDOL LACTATE 5 MG/ML IJ SOLN
5.0000 mg | Freq: Once | INTRAMUSCULAR | Status: AC
  Administered 2020-07-20: 5 mg via INTRAMUSCULAR
  Filled 2020-07-20: qty 1

## 2020-07-20 MED ORDER — POTASSIUM CHLORIDE CRYS ER 20 MEQ PO TBCR
20.0000 meq | EXTENDED_RELEASE_TABLET | Freq: Once | ORAL | Status: AC
  Administered 2020-07-20: 20 meq via ORAL
  Filled 2020-07-20: qty 1

## 2020-07-20 MED ORDER — OLANZAPINE 5 MG PO TBDP
5.0000 mg | ORAL_TABLET | Freq: Two times a day (BID) | ORAL | Status: DC
  Administered 2020-07-20: 5 mg via ORAL
  Filled 2020-07-20: qty 1

## 2020-07-20 MED ORDER — OLANZAPINE 5 MG PO TBDP: 5.0000 mg | ORAL_TABLET | Freq: Two times a day (BID) | ORAL | 0 refills | Status: DC

## 2020-07-20 MED ORDER — DIPHENHYDRAMINE HCL 50 MG PO CAPS: 50.0000 mg | ORAL_CAPSULE | Freq: Three times a day (TID) | ORAL | Status: DC | PRN

## 2020-07-20 MED ORDER — DIPHENHYDRAMINE HCL 50 MG PO CAPS: 50.0000 mg | ORAL_CAPSULE | Freq: Once | ORAL | Status: AC

## 2020-07-20 NOTE — ED Notes (Signed)
Patient appears to be sleeping.  Intermittently repositioning self.  RR even and unlabored.  Continue to monitor for safety.

## 2020-07-20 NOTE — BH Assessment (Signed)
Pt's mother, Ronda Rajkumar, calling this morning with request for update on dtr, who was admitted to [facility] OBS 07/19/20. Her phone number is [phone]. Confirmation of admission not provided to caller.

## 2020-07-20 NOTE — ED Notes (Signed)
Patient sitting in dayroom.  Per Corene Cornea, NP, patient is IVC'd.  Patient teaching done regarding PO Haldol and Benadryl.  Patient stated, "No, thank you."  Injections drawn up.  Patient teaching done regarding injections.  Patient initially reluctant but then agreed to injections in deltoids bilat.  Patient currently resting on bed; eyes closed.  RR even and unlabored.
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
## 2020-07-20 NOTE — ED Notes (Signed)
Patient attempting to call mother.  Crying and stated that she doesn't want to stay here tonight.  Patient very difficult to redirect.  Provider notified.

## 2020-07-20 NOTE — ED Notes (Signed)
Resting with eyes closed. Rise and fall of chest noted. No new issues at this time. Will continue to monitor for safety

## 2020-07-20 NOTE — ED Notes (Signed)
Educated pt on d/c instructions and f/u care. Verbalized understanding. Ambulated per self to retrieve belongings. No SI, HI, or AVH noted. No s/s pain, discomfort, or acute distress. Escorted to front lobby and d/c into custody of mother. Medically stable on d/c

## 2020-07-20 NOTE — ED Notes (Signed)
Tried to get pt up and take shower to help wake her up. Pt refused stating she would just take one when she got home. Advised then she needed to wake up and sit up so she would be ready when mother arrived to d/c her. Pt sitting up on side of bed now

## 2020-07-20 NOTE — Discharge Instructions (Addendum)

## 2020-07-26 ENCOUNTER — Other Ambulatory Visit: Payer: Self-pay

## 2020-07-26 ENCOUNTER — Emergency Department (HOSPITAL_COMMUNITY)
Admission: EM | Admit: 2020-07-26 | Discharge: 2020-07-28 | Disposition: A | Discharge status: Home / Self Care | Payer: Medicaid Other | Attending: Emergency Medicine | Admitting: Emergency Medicine

## 2020-07-26 DIAGNOSIS — Z20822 Contact with and (suspected) exposure to covid-19: Secondary | ICD-10-CM | POA: Insufficient documentation

## 2020-07-26 DIAGNOSIS — F39 Unspecified mood [affective] disorder: Secondary | ICD-10-CM | POA: Diagnosis not present

## 2020-07-26 DIAGNOSIS — R4182 Altered mental status, unspecified: Secondary | ICD-10-CM | POA: Diagnosis present

## 2020-07-26 DIAGNOSIS — F23 Brief psychotic disorder: Secondary | ICD-10-CM | POA: Diagnosis not present

## 2020-07-26 DIAGNOSIS — J45909 Unspecified asthma, uncomplicated: Secondary | ICD-10-CM | POA: Insufficient documentation

## 2020-07-26 DIAGNOSIS — F411 Generalized anxiety disorder: Secondary | ICD-10-CM | POA: Diagnosis present

## 2020-07-26 LAB — URINALYSIS, COMPLETE (UACMP) WITH MICROSCOPIC
Bilirubin Urine: NEGATIVE
Glucose, UA: NEGATIVE mg/dL
Ketones, ur: NEGATIVE mg/dL
Nitrite: NEGATIVE
Protein, ur: NEGATIVE mg/dL
Specific Gravity, Urine: 1.021 (ref 1.005–1.030)
pH: 6 (ref 5.0–8.0)

## 2020-07-26 LAB — COMPREHENSIVE METABOLIC PANEL
ALT: 16 U/L (ref 0–44)
AST: 24 U/L (ref 15–41)
Albumin: 3.9 g/dL (ref 3.5–5.0)
Alkaline Phosphatase: 66 U/L (ref 38–126)
Anion gap: 8 (ref 5–15)
BUN: 13 mg/dL (ref 6–20)
CO2: 26 mmol/L (ref 22–32)
Calcium: 8.9 mg/dL (ref 8.9–10.3)
Chloride: 105 mmol/L (ref 98–111)
Creatinine, Ser: 0.88 mg/dL (ref 0.44–1.00)
GFR, Estimated: >60 mL/min (ref >60)
Glucose, Bld: 103 mg/dL — ABNORMAL HIGH (ref 70–99)
Potassium: 3.6 mmol/L (ref 3.5–5.1)
Sodium: 139 mmol/L (ref 135–145)
Total Bilirubin: 0.6 mg/dL (ref 0.3–1.2)
Total Protein: 6.7 g/dL (ref 6.5–8.1)

## 2020-07-26 LAB — CBC WITH DIFFERENTIAL/PLATELET
Abs Immature Granulocytes: 0.02 10*3/uL (ref 0.00–0.07)
Basophils Absolute: 0 10*3/uL (ref 0.0–0.1)
Basophils Relative: 1 %
Eosinophils Absolute: 0 10*3/uL (ref 0.0–0.5)
Eosinophils Relative: 1 %
HCT: 41.5 % (ref 36.0–46.0)
Hemoglobin: 14 g/dL (ref 12.0–15.0)
Immature Granulocytes: 0 %
Lymphocytes Relative: 25 %
Lymphs Abs: 1.6 10*3/uL (ref 0.7–4.0)
MCH: 32.2 pg (ref 26.0–34.0)
MCHC: 33.7 g/dL (ref 30.0–36.0)
MCV: 95.4 fL (ref 80.0–100.0)
Monocytes Absolute: 0.6 10*3/uL (ref 0.1–1.0)
Monocytes Relative: 10 %
Neutro Abs: 4 10*3/uL (ref 1.7–7.7)
Neutrophils Relative %: 63 %
Platelets: 254 10*3/uL (ref 150–400)
RBC: 4.35 MIL/uL (ref 3.87–5.11)
RDW: 12.4 % (ref 11.5–15.5)
WBC: 6.3 10*3/uL (ref 4.0–10.5)
nRBC: 0 % (ref 0.0–0.2)

## 2020-07-26 LAB — RAPID URINE DRUG SCREEN, HOSP PERFORMED
Amphetamines: NOT DETECTED
Barbiturates: NOT DETECTED
Benzodiazepines: POSITIVE — AB
Cocaine: NOT DETECTED
Opiates: NOT DETECTED
Tetrahydrocannabinol: NOT DETECTED

## 2020-07-26 LAB — ETHANOL: Alcohol, Ethyl (B): <10 mg/dL (ref <10)

## 2020-07-26 LAB — RESP PANEL BY RT-PCR (FLU A&B, COVID) ARPGX2
Influenza A by PCR: NEGATIVE
Influenza B by PCR: NEGATIVE
SARS Coronavirus 2 by RT PCR: NEGATIVE

## 2020-07-26 LAB — I-STAT BETA HCG BLOOD, ED (MC, WL, AP ONLY): I-stat hCG, quantitative: <5 m[IU]/mL (ref <5)

## 2020-07-26 LAB — CBG MONITORING, ED: Glucose-Capillary: 96 mg/dL (ref 70–99)

## 2020-07-26 LAB — AMMONIA: Ammonia: 35 umol/L (ref 9–35)

## 2020-07-26 MED ORDER — LORAZEPAM 2 MG/ML IJ SOLN
2.0000 mg | Freq: Once | INTRAMUSCULAR | Status: AC
  Administered 2020-07-26: 2 mg via INTRAMUSCULAR
  Filled 2020-07-26: qty 1

## 2020-07-26 MED ORDER — OLANZAPINE 5 MG PO TBDP
5.0000 mg | ORAL_TABLET | Freq: Two times a day (BID) | ORAL | Status: DC
  Administered 2020-07-27 (×2): 5 mg via ORAL
  Filled 2020-07-26 (×2): qty 1

## 2020-07-26 MED ORDER — SERTRALINE HCL 100 MG PO TABS
100.0000 mg | ORAL_TABLET | Freq: Every day | ORAL | Status: DC
  Administered 2020-07-27 – 2020-07-28 (×2): 100 mg via ORAL
  Filled 2020-07-26 (×2): qty 1

## 2020-07-26 MED ORDER — ZIPRASIDONE MESYLATE 20 MG IM SOLR
10.0000 mg | Freq: Once | INTRAMUSCULAR | Status: AC
  Administered 2020-07-26: 10 mg via INTRAMUSCULAR
  Filled 2020-07-26: qty 20
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
## 2020-07-26 NOTE — ED Notes (Signed)
Staffing called sitter requested

## 2020-07-26 NOTE — ED Notes (Signed)
Pt's behaviors de-escalating. Pt no resting. PO meds ordered. Placed on hold till pt awakens.

## 2020-07-26 NOTE — ED Notes (Signed)
Sitter at bed. Pt continues to sleep. Restraints removed.

## 2020-07-26 NOTE — BH Assessment (Signed)
TTS attempted to reach RN for telepsych via phone but unable to reach. Secure chat sent, per Epic RN in a trauma. Will try to reach again.

## 2020-07-26 NOTE — ED Provider Notes (Signed)
Emergency Medicine Provider Triage Evaluation Note  Candace King , a 20 y.o. female  was evaluated in triage.  Pt complains of altered mental status and response to internal stimulus.  Her mother is at the bedside and states that she has a" childlike history of panic disorder, her father's history of schizoaffective disorder as well as bipolar, but is out of her life now.  She states that for the last month Candace King has been behaving erratically, responding to internal stimuli, emotionally labile.  Has been seen multiple emergency departments for the same complaint, including behavioral urgent care we discharged her home with Zyprexa prescription.  States she is not taking her sertraline or Zyprexa.  According to the patient mother, she has been tearing apart their home, responding to internal stimuli.  Review of Systems  Positive: Altered mental status, responding to internal stimuli, aggressive behavior Negative: Syncope, rash, nausea, vomiting  Physical Exam  BP 129/90 (BP Location: Right Arm)   Pulse (!) 120   Temp 97.8 F (36.6 C) (Oral)   Resp 18   SpO2 100%  Gen:   Awake, no distress   Resp:  Normal effort  MSK:   Moves extremities without difficulty  Other:  Tachycardic, emotionally labile in triage, alternating between smiling and screaming and fear.  Patient appears to be responding to internal stimulus, making nonsensical comments like "I cannot see and I cannot hear, even while she is working on the room and answering questions verbally.  Mydriasis with lesion of pupils bilaterally to 9 mm, PERRL, EOMI, moving all 4 extremities spontaneously without difficulty  Medical Decision Making  Medically screening exam initiated at 12:23 PM.  Appropriate orders placed.  Candace King was informed that the remainder of the evaluation will be completed by another provider, this initial triage assessment does not replace that evaluation, and the importance of remaining in the ED until  their evaluation is complete.  Will proceed with altered mental status work-up as well as medical clearance; CT scan not ordered at this time per patient's mother's request, she had recent normal CT head.  This chart was dictated using voice recognition software, Dragon. Despite the best efforts of this provider to proofread and correct errors, errors may still occur which can change documentation meaning.    Candace King 07/26/20 1229    Candace Chapel, MD 07/27/20 1031
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
## 2020-07-26 NOTE — ED Notes (Signed)
IVC paperwork completed.  

## 2020-07-26 NOTE — ED Notes (Signed)
Pt continues to try to run out the room and avoiding TTS to be completed. Mom at the bedside states she is unable to control her daughter. ED provider notified and pt redirected to her room.

## 2020-07-26 NOTE — BH Assessment (Addendum)
Disposition Update:   Shuvon Rankin, NP recommends in patient treatment. Per [place] Maudie Mercury, [place]) [facility] does not have an appropriate bed. Patient faxed out to  facilities for consideration of bed placement. Patient's nurse Lavina Hamman, RN), provided disposition updates. See hospitals listed below.   Destination  Service Provider Request Status Selected Services Address Phone Fax Patient Preferred  [place] [address], [place] [address] [place] --  [place] N/A [address], [place] [address] [phone] --  [facility]  Pending - Request Sent N/A [address] [place] [address] [phone] --  [facility] [place] [address] [phone] --  [place] [phone] [address] [place] [address] [place] --  [facility]  Pending - Request Sent N/A [address] [place] [address] [phone] [facility]  Pending - Request Sent N/A [address] [place], [place] [address] [phone] --  [facility]  Pending - Request Sent N/A [address], [place] [address] [phone] --  [facility]  Pending - Request Sent N/A [address] [place] [address] [phone] --  [facility]  Pending - Request Sent N/A [address] [place] [address] [phone] --  [facility]  Pending - Request Sent N/A [address] [place] [address] [phone] --  [place] N/A [address], [place] [address] [phone] --  [place]   Pending - Request Sent N/A [address] [place] [address] [place] --  [facility]  Pending - Request Sent N/A [place] [place] [address] [phone] --  [facility]  Pending - Request Sent N/A [address] [place] [address] [phone] --  [place] N/A [address] [place] [phone] --  [place] N/A [address], [place] [address] [phone] --  [facility]  Pending - Request Sent N/A [place], [place] [address] [phone] --  [facility]  Pending - Request Sent N/A [address], [place] [address] [phone] --  [facility]  Pending - Request Sent N/A [address], [place] [address] [phone] --  [place] [address], [place] [address] [phone] --  [place] N/A [address], [place] [address] [phone] --  [place], [place] [address] [phone] --  [place] [address], [place] [address] [phone]

## 2020-07-26 NOTE — ED Notes (Signed)
Transitioned pt of off bilateral ankle restraints. Pt resting and comfortable. No sitter available.

## 2020-07-26 NOTE — ED Provider Notes (Signed)
[facility] Provider Note  CSN: [id number] Arrival date & time: 07/26/20 1203    History Chief Complaint  Patient presents with   Altered Mental Status     Altered Mental Status  Candace King is a 20 y.o. female brought to the ED for re-evaluation of increasingly bizarre behaviors over the last several weeks. Mother reports several life stressors over the last few years including deaths in the family, divorce and her sister graduating from college in May. The patient has been seen in the [facility] ED and [facility] x 2. Medical workup has been negative, she has been tried on several psychiatric medications but has not tolerated side effects well. She has been more difficult to manage at home for mother and so she was brought back to the ED again today for evaluation.    Past Medical History:  Diagnosis Date   Asthma    Telangiectasia     Past Surgical History:  Procedure Laterality Date   TONSILLECTOMY      Family History  Problem Relation Age of Onset   Anxiety disorder Mother    Hypertension Mother    Cancer Mother    Breast cancer Paternal Grandmother    Hypertension Father    Cancer Maternal Aunt    Cancer Maternal Uncle    Stroke Maternal Grandmother    Heart attack Maternal Grandmother    Hyperlipidemia Maternal Grandmother    Hyperlipidemia Maternal Grandfather    Anxiety disorder Paternal Grandfather     Social History   Tobacco Use   Smoking status: Never   Smokeless tobacco: Never  Vaping Use   Vaping Use: Never used  Substance Use Topics   Alcohol use: No   Drug use: Not Currently     Home Medications Prior to Admission medications   Medication Sig Start Date End Date Taking? Authorizing Provider  albuterol (PROVENTIL HFA;VENTOLIN HFA) 108 (90 Base) MCG/ACT inhaler Inhale 1-2 puffs into the lungs every 6 (six) hours as needed for wheezing or shortness of breath.   Yes [provider]  ALPRAZolam Duanne Moron) 1 MG tablet  Take 1 mg by mouth at bedtime as needed for anxiety or sleep.   Yes [provider]  ipratropium (ATROVENT) 0.03 % nasal spray Place 1 spray into both nostrils. 07/06/20  Yes [provider]  Naphazoline HCl (CLEAR EYES OP) Place 1 drop into both eyes daily as needed (For dry eyes).   Yes [provider]  pantoprazole (PROTONIX) 40 MG tablet Take 1 tablet (40 mg total) by mouth 2 (two) times daily. 08/16/19 07/26/20 Yes Allie Bossier, MD  polyethylene glycol (MIRALAX / GLYCOLAX) 17 g packet Take 17 g by mouth daily as needed for mild constipation. 08/16/19  Yes Allie Bossier, MD  Probiotic Product (PROBIOTIC PO) Take 1 tablet by mouth daily.   Yes [provider]  QVAR REDIHALER 80 MCG/ACT inhaler Inhale 1 puff into the lungs 2 (two) times daily.  01/14/18  Yes [provider]  sertraline (ZOLOFT) 100 MG tablet Take 100 mg by mouth daily.   Yes [provider]  SUMAtriptan (IMITREX) 50 MG tablet Take 1 tablet by mouth as needed for migraine.   Yes [provider]  Vitamin D, Ergocalciferol, (DRISDOL) 1.25 MG (50000 UNIT) CAPS capsule Take 1 capsule by mouth once a week. 04/26/20  Yes [provider]  OLANZapine zydis (ZYPREXA) 5 MG disintegrating tablet Take 1 tablet (5 mg total) by mouth 2 (two) times  daily. Patient not taking: No sig reported 07/20/20   Lucky Rathke, FNP     Allergies    Augmentin [amoxicillin-pot clavulanate], Gabapentin, Morphine and related, and Topiramate   Review of Systems   Review of Systems A comprehensive review of systems was completed and negative except as noted in HPI.    Physical Exam BP 105/63   Pulse 71   Temp 97.8 F (36.6 C) (Oral)   Resp 19   SpO2 100%   Physical Exam Vitals and nursing note reviewed.  HENT:     Head: Normocephalic.     Nose: Nose normal.  Eyes:     Extraocular Movements: Extraocular movements intact.  Pulmonary:     Effort: Pulmonary effort is normal.   Musculoskeletal:        General: Normal range of motion.     Cervical back: Neck supple.  Skin:    Findings: No rash (on exposed skin).  Neurological:     General: No focal deficit present.     Mental Status: She is alert.  Psychiatric:     Comments: Flat affect, psychomotor slowing     ED Results / Procedures / Treatments   Labs (all labs ordered are listed, but only abnormal results are displayed) Labs Reviewed  COMPREHENSIVE METABOLIC PANEL - Abnormal; Notable for the following components:      Result Value   Glucose, Bld 103 (*)    All other components within normal limits  URINALYSIS, COMPLETE (UACMP) WITH MICROSCOPIC - Abnormal; Notable for the following components:   APPearance HAZY (*)    Hgb urine dipstick SMALL (*)    Leukocytes,Ua SMALL (*)    Bacteria, UA RARE (*)    Non Squamous Epithelial 0-5 (*)    All other components within normal limits  RAPID URINE DRUG SCREEN, HOSP PERFORMED - Abnormal; Notable for the following components:   Benzodiazepines POSITIVE (*)    All other components within normal limits  RESP PANEL BY RT-PCR (FLU A&B, COVID) ARPGX2  CBC WITH DIFFERENTIAL/PLATELET  ETHANOL  AMMONIA  CBG MONITORING, ED  I-STAT BETA HCG BLOOD, ED ([facility], [facility], [facility] ONLY)    EKG EKG Interpretation  Date/Time:  Tuesday July 26 2020 12:44:26 EDT Ventricular Rate:  102 PR Interval:  128 QRS Duration: 76 QT Interval:  412 QTC Calculation: 536 R Axis:   90 Text Interpretation: Sinus tachycardia Right atrial enlargement Rightward axis Nonspecific ST abnormality Prolonged QT Abnormal ECG Since last tracing QT prolonged Confirmed by [name] [phone]) on 07/26/2020 2:04:24 PM  Radiology No results found.  Procedures .Critical Care  Date/Time: 07/26/2020 10:46 PM Performed by: Truddie Hidden, MD Authorized by: Truddie Hidden, MD   Critical care provider statement:    Critical care time (minutes):  45   Critical care time was exclusive of:   Separately billable procedures and treating other patients   Critical care was necessary to treat or prevent imminent or life-threatening deterioration of the following conditions:  CNS failure or compromise   Critical care was time spent personally by me on the following activities:  Discussions with consultants, evaluation of patient's response to treatment, examination of patient, ordering and performing treatments and interventions, ordering and review of laboratory studies, ordering and review of radiographic studies, pulse oximetry, re-evaluation of patient's condition, obtaining history from patient or surrogate and review of old charts  Medications Ordered in the ED Medications  sertraline (ZOLOFT) tablet 100 mg (0 mg Oral Hold 07/26/20 2102)  OLANZapine zydis (  ZYPREXA) disintegrating tablet 5 mg (0 mg Oral Hold 07/26/20 2102)  ziprasidone (GEODON) injection 10 mg (10 mg Intramuscular Given 07/26/20 1909)  LORazepam (ATIVAN) injection 2 mg (2 mg Intramuscular Given 07/26/20 1929)     MDM Rules/Calculators/A&P MDM  Patient here with increasingly bizarre behaviors, has been seen in ED twice recently with negative medical evaluation including consultation by neurology at [facility]. Her medical workup today is unremarkable. Will place in Psych Hold and consult TTS.   ED Course  I have reviewed the triage vital signs and the nursing notes.  Pertinent labs & imaging results that were available during my care of the patient were reviewed by me and considered in my medical decision making (see chart for details).  Clinical Course as of 07/26/20 2246  Tue Jul 26, 2020  1801 Patient seen running down the hallway, yelling at ED staff and hiding in the bathroom to avoid speaking to TTS. She was redirected to the exam room and encouraged to sit down and participate in the TTS evaluation.  [CS]  1839 Patient seen by TTS, recommends inpatient admission.  [CS]  1925 Patient has become  increasingly agitated, not cooperating with staff, attempting to leave the department. Given Geodon with minimal improvement. Soft restraints ordered as well as Ativan IM.  [CS]    Clinical Course User Index [CS] Truddie Hidden, MD    Final Clinical Impression(s) / ED Diagnoses Final diagnoses:  Acute psychosis [facility])    Rx / [place] Orders ED Discharge Orders     None        Truddie Hidden, MD 07/26/20 2247

## 2020-07-26 NOTE — ED Notes (Signed)
Mom took all pt belongings home.

## 2020-07-26 NOTE — ED Notes (Addendum)
Pt behavior continues to escalate following Geodon injection. Security staff at bedside following interaction. Pt charged at an office with pen with intent to harm. States she wanted to kill him. Pt was physically restrained by officer onto bed. Pt was kicking and attempting to escape. Pt is yelling that she does not want to be here and that she is no her father. This was the second escalation of behavior. Initial during shift change requiring Geodon IM.   Violent restraints initiated and Ativan given at this time.

## 2020-07-26 NOTE — ED Notes (Signed)
Pt very agitated on the room, trying to leave the hospital, security called and Geodon IM given.

## 2020-07-26 NOTE — ED Notes (Signed)
Pt refusing to sit on the bed, pt stay on wheel chair on arrival to the room family at the bedside.

## 2020-07-26 NOTE — BH Assessment (Signed)
Candace Rankin, NP recommends in patient treatment. Myrna Blazer, RN notified of disposition

## 2020-07-27 ENCOUNTER — Encounter (HOSPITAL_COMMUNITY): Payer: Self-pay | Admitting: Registered Nurse

## 2020-07-27 DIAGNOSIS — F39 Unspecified mood [affective] disorder: Secondary | ICD-10-CM | POA: Diagnosis present

## 2020-07-27 DIAGNOSIS — F411 Generalized anxiety disorder: Secondary | ICD-10-CM | POA: Diagnosis present

## 2020-07-27 NOTE — ED Notes (Signed)
TTS initiated 

## 2020-07-27 NOTE — ED Notes (Addendum)
Pt up to b/r for shower. 2nd portion of breakfast ordered per request. Declined future morning meds (zoloft & zyprexxa) when pre-eminently proactively explained. Refused last nights dose. Will offer again.

## 2020-07-27 NOTE — ED Notes (Signed)
TTS complete 
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
## 2020-07-27 NOTE — ED Provider Notes (Signed)
Emergency Medicine Observation Re-evaluation Note  Candace King is a 20 y.o. female, seen on rounds today.  Pt initially presented to the ED for complaints of Altered Mental Status Currently, the patient is awaiting inpatient admission.  Patient presented with unusual behavior felt to be perhaps psychotic.  [name] is determined patient warrants inpatient admission.  Awaiting placement.  Patient is IVC.  Physical Exam  BP (!) 114/102 (BP Location: Left Arm)   Pulse (!) 106   Temp 98.2 F (36.8 C) (Oral)   Resp 16   SpO2 97%  Physical Exam General: No acute distress Cardiac:  Lungs:  Psych: Resting.  Reports of some childish behavior.  But no outburst.  ED Course / MDM  EKG:EKG Interpretation  Date/Time:  Tuesday July 26 2020 12:44:26 EDT Ventricular Rate:  102 PR Interval:  128 QRS Duration: 76 QT Interval:  412 QTC Calculation: 536 R Axis:   90 Text Interpretation: Sinus tachycardia Right atrial enlargement Rightward axis Nonspecific ST abnormality Prolonged QT Abnormal ECG Since last tracing QT prolonged Confirmed by Candace King [id number]) on 07/26/2020 2:04:24 PM  I have reviewed the labs performed to date as well as medications administered while in observation.  Recent changes in the last 24 hours include no significant changes.  Valuated behavioral health recommending inpatient admission.  Reports of some child's behavior.  But no outburst.  Plan  Current plan is for behavioral health inpatient admission. Patient is under full IVC at this time.   Candace Sorrow, MD 07/27/20 [phone]

## 2020-07-27 NOTE — ED Notes (Signed)
Calmer. Sitting in chair talking with sitter, NAD, calm, interactive.

## 2020-07-27 NOTE — Progress Notes (Signed)
Pt accepted to [facility]      Patient meets inpatient criteria per Earleen Newport, NP   Dr. Jonelle Sports is the attending provider.    Call report to [phone]  Onalee Hua, RN @ [facility] ED notified.     Pt scheduled  to arrive at [place].   Mariea Clonts, MSW, LCSW-A  10:37 AM 07/27/2020

## 2020-07-27 NOTE — Progress Notes (Signed)
Patient has been faxed out due to no bed availability at [facility]. Patient meets inpatient criteria per [facility] Rankin,NP. Patient referred to the following facilities:  [facility]  [address], [place] [address] [phone] [facility]  [address] [place] [address] [phone]  [facility]  [address] [place] [address] [place]  [facility] [address] [place] [address] [phone] [facility]  [address] [place] [address] [place]  [facility]  [address], [place] [address] [phone] [facility]  [address] [place], [place] [address] [place]  [facility]  [address] [place] [address] [place]  [facility]  [address] [place] [address] [phone]  [facility]  [place] [address] [place] [address] [phone]  [facility]  [address], [place] [address] [phone] [place]  [address], [place] [address] [phone] [place]  [address] [place] [address] [phone]  [facility]  [address] [place] [address] [phone] [facility]  [address], [place] [address] [phone] [facility]  [address] [place] [address] [phone]  [facility]  [address] [place] [address] [phone]  [facility]  [address] [place] [address] [place]  [facility]  [address] [place] [address] [phone]  [facility]  [address] [place] [address] [phone] [place]  [address] [place] [address] [phone]  [facility]  [address] [place] [address] [phone]  [facility]  [address] [place] [address] [phone]  [facility]  [address], [place] [address] [phone]    CSW will continue to monitor disposition.     Mariea Clonts, MSW, LCSW-A  10:12 AM 07/27/2020

## 2020-07-27 NOTE — ED Notes (Signed)
Standing, tearful, mild hyperventilation, speaking with sitter at doorway

## 2020-07-27 NOTE — ED Notes (Addendum)
Speaking with mother by phone, relaying that she doesn't like the way meds make her feel. Relates all negative feelings and physiologic sx to side effects of meds.

## 2020-07-27 NOTE — ED Notes (Signed)
Fearful of medications, side effects, as well as normal physiologic symptoms.

## 2020-07-27 NOTE — ED Notes (Signed)
Breakfast order placed ?

## 2020-07-27 NOTE — ED Notes (Signed)
Per Eugenie Filler, LCSW: Patient has been accepted by [facility] for tomorrow after 0800. RN to call report to [phone]

## 2020-07-27 NOTE — ED Notes (Signed)
Pt spoke with mother by phone, her second phone call for today. Mother and pt updated about [facility] tomorrow after 8am.

## 2020-07-28 MED ORDER — OLANZAPINE 5 MG PO TBDP: 5.0000 mg | ORAL_TABLET | Freq: Every day | ORAL | Status: DC

## 2020-07-28 NOTE — ED Notes (Signed)
Attempted report to [facility]

## 2020-07-28 NOTE — ED Notes (Signed)
Pt discharged to [facility] with sheriff. All documents sent with officer, including EMTALA, eMAR, facesheet and ticket to ride. Pt only had personal blanket and coloring book sent with her.

## 2020-07-28 NOTE — ED Provider Notes (Signed)
Emergency Medicine Observation Re-evaluation Note  Candace King is a 20 y.o. female, seen on rounds today.  Pt initially presented to the ED for complaints of Altered Mental Status Currently, the patient is resting in bed watching TV. Has no current complaints.  Physical Exam  BP (!) 123/91 (BP Location: Right Arm)   Pulse 98   Temp 98 F (36.7 C) (Oral)   Resp 18   SpO2 100%  Physical Exam General: no acute distress Cardiac: RRR Lungs: no respiratory distress Psych: flat mood.  Quiet speech.  Slow to answer questions  ED Course / MDM  EKG:EKG Interpretation  Date/Time:  Tuesday July 26 2020 12:44:26 EDT Ventricular Rate:  102 PR Interval:  128 QRS Duration: 76 QT Interval:  412 QTC Calculation: 536 R Axis:   90 Text Interpretation: Sinus tachycardia Right atrial enlargement Rightward axis Nonspecific ST abnormality Prolonged QT Abnormal ECG Since last tracing QT prolonged Confirmed by [name] [phone]) on 07/26/2020 2:04:24 PM  I have reviewed the labs performed to date as well as medications administered while in observation.  Recent changes in the last 24 hours include - none.  Plan  Current plan is for transfer to psychiatric facility for ongoing care. Patient is under full IVC at this time.   Quintella Reichert, MD 07/28/20 1102

## 2020-07-28 NOTE — ED Notes (Signed)
Sheriff called in regards to transport pt to [facility], states they will call back with an ETA

## 2020-07-28 NOTE — ED Notes (Signed)
Pt called her mother and requested to speak to RN about placement. Mother concerned about pt being sent to [facility] due to not being able to visit easily.

## 2020-07-28 NOTE — Consult Note (Addendum)
Candace King  EKG on 07/26/20 shows QTC 536. Discontinued Zyprexa Zydis 5 mg PO BID and will repeat EKG.   Vernell Leep., RN., made aware via secure chat.   Repeat EKG QTC is now 474. Will restart Zyprexa Zydis 5 PO at hs.

## 2020-07-28 NOTE — ED Notes (Signed)
Report given to [facility]

## 2020-08-12 ENCOUNTER — Other Ambulatory Visit: Payer: Self-pay

## 2020-08-12 ENCOUNTER — Encounter (HOSPITAL_COMMUNITY): Payer: Self-pay | Admitting: Psychiatry

## 2020-08-12 ENCOUNTER — Telehealth (INDEPENDENT_AMBULATORY_CARE_PROVIDER_SITE_OTHER): Payer: Medicaid Other | Admitting: Psychiatry

## 2020-08-12 DIAGNOSIS — F411 Generalized anxiety disorder: Secondary | ICD-10-CM

## 2020-08-12 DIAGNOSIS — F331 Major depressive disorder, recurrent, moderate: Secondary | ICD-10-CM | POA: Diagnosis not present

## 2020-08-12 MED ORDER — TRAZODONE HCL 50 MG PO TABS: 50.0000 mg | ORAL_TABLET | Freq: Every evening | ORAL | 2 refills | Status: DC | PRN

## 2020-08-12 MED ORDER — SERTRALINE HCL 25 MG PO TABS: 25.0000 mg | ORAL_TABLET | Freq: Every day | ORAL | 2 refills | Status: DC
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
## 2020-08-16 ENCOUNTER — Telehealth (HOSPITAL_COMMUNITY): Payer: Self-pay | Admitting: Psychiatry

## 2020-08-16 ENCOUNTER — Other Ambulatory Visit (HOSPITAL_COMMUNITY): Payer: Self-pay | Admitting: Psychiatry

## 2020-08-16 NOTE — Telephone Encounter (Signed)
Patient and mother, Candace King, called reporting unable to sleep and voiced other questions and concerns with taking TRAZODONE.  Phone number confirmed [phone] for return call.

## 2020-08-16 NOTE — Telephone Encounter (Signed)
Patient's mother called informing patient that she did not tolerate trazodone well and discontinued it.  The patient and her mother notes that she has been sleeping well without trazodone.  Provider endorsed understanding and discontinued the medication from her medication list.  She also informed writer that Zoloft in the past was prescribed by her primary care doctor.  Provider informed patient that Zoloft could be refilled by Probation officer.  They endorsed understanding and agreed.  No other concerns at this time.

## 2020-08-25 ENCOUNTER — Other Ambulatory Visit: Payer: Self-pay

## 2020-08-25 ENCOUNTER — Ambulatory Visit (INDEPENDENT_AMBULATORY_CARE_PROVIDER_SITE_OTHER): Payer: Medicaid Other | Admitting: Licensed Clinical Social Worker

## 2020-08-25 DIAGNOSIS — F411 Generalized anxiety disorder: Secondary | ICD-10-CM | POA: Diagnosis not present
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
## 2020-09-03 ENCOUNTER — Other Ambulatory Visit (HOSPITAL_COMMUNITY): Payer: Self-pay | Admitting: Psychiatry

## 2020-09-03 DIAGNOSIS — F331 Major depressive disorder, recurrent, moderate: Secondary | ICD-10-CM

## 2020-09-03 DIAGNOSIS — F411 Generalized anxiety disorder: Secondary | ICD-10-CM

## 2020-10-21 ENCOUNTER — Ambulatory Visit (INDEPENDENT_AMBULATORY_CARE_PROVIDER_SITE_OTHER): Payer: Medicaid Other | Admitting: Licensed Clinical Social Worker

## 2020-10-21 DIAGNOSIS — F411 Generalized anxiety disorder: Secondary | ICD-10-CM

## 2020-10-28 NOTE — Progress Notes (Signed)
THERAPIST PROGRESS NOTE   Virtual Visit via Video Note  I connected with Candace King on 10/21/20 at 11:00 AM EDT by a video enabled telemedicine application and verified that I am speaking with the correct person using two identifiers.  Location: Patient: Home Provider: Home   I discussed the limitations of evaluation and management by telemedicine and the availability of in person appointments. The patient expressed understanding and agreed to proceed. I discussed the assessment and treatment plan with the patient. The patient was provided an opportunity to ask questions and all were answered. The patient agreed with the plan and demonstrated an understanding of the instructions.   The patient was advised to call back or seek an in-person evaluation if the symptoms worsen or if the condition fails to improve as anticipated.  I provided 55 minutes of non-face-to-face time during this encounter.  Participation Level: Active  Behavioral Response: CasualAlertAnxious  Type of Therapy: Individual Therapy  Treatment Goals addressed: Anxiety and Coping  Interventions: Supportive and Other: additional assessment  Summary: Candace King is a 20 y.o. female who presents with hx of GAD. This date pt signs on for video session but connection is poor. The audio and video intermittently break up and fail. Pt agrees to move to phone only part way through the session. This is first session since initial session 08/25/20. Pt has c/o about frequency of care saying she is used to seeing counselor 1xwk. LCSW advised of current work flow challenges with scheduling. Provided info on [employer] [facility] [place] and [place] as options for her. Pt verbalizes understanding. Pt has notable delays in her responses throughout session and speaks slow with low tone. She reports she is taking meds as prescribed. States "alright" when asked how her mood has been. She feels her anx is well managed at present  but has been feeling more stress with depression recently. She rates feelings of dep at 4-5 on a 0-10 scale. Pt continues to live with her mother. She states they are under quite a bit of financial strain and pt has goal of getting work to help. Pt reports she is searching for a remote job so she will not have gas expenses and feels comfortable at home. Referral made to [facility] for assist with job search. Pt reports her father has not paid alimony in 2+ years. LCSW assessed for pt's willingness to talk more about her estrangement from father. Pt is guarded but does mention father essentially abandoned the fam when he left ~ 3 yrs ago. She reports he will intermittently try to text her but she does not reply. She reports he is living with his parents in [place] as far as she knows. Pt reports poor relationship with paternal grandparents. Pt reports she finds it "draining" to talk about father and she has already talked about him with other counselors. Pt states "I don't feel comfortable right now". Validated counseling can include uncomfortable topics and the importance of processing/externalizing past hurts. Pt states "I wouldn't be where I am without lots of therapy". Assessed for other goals pt has. She states she needs to get her driver's license. Reports she has been given her grandfather's car. Pt advises her mother is continuing to work on Building [employer] estate since his death in 05-05-2019. Pt reports she and mother getting along well. LCSW reviewed poc including scheduling prior to close of session. Pt states appreciation for care.     Suicidal/Homicidal: Nowithout intent/plan  Therapist  Response: Pt somewhat receptive to care.  Plan: Return again in ~3 weeks.  Diagnosis: Axis I: Generalized Anxiety Disorder  Hermine Messick, LCSW 10/28/2020

## 2020-11-07 ENCOUNTER — Other Ambulatory Visit: Payer: Self-pay

## 2020-11-07 ENCOUNTER — Encounter (HOSPITAL_COMMUNITY): Payer: Self-pay | Admitting: Psychiatry

## 2020-11-07 ENCOUNTER — Telehealth (INDEPENDENT_AMBULATORY_CARE_PROVIDER_SITE_OTHER): Payer: Medicaid Other | Admitting: Psychiatry

## 2020-11-07 DIAGNOSIS — F411 Generalized anxiety disorder: Secondary | ICD-10-CM | POA: Diagnosis not present

## 2020-11-07 DIAGNOSIS — F331 Major depressive disorder, recurrent, moderate: Secondary | ICD-10-CM

## 2020-11-07 MED ORDER — SERTRALINE HCL 25 MG PO TABS: 25.0000 mg | ORAL_TABLET | Freq: Every day | ORAL | 3 refills | Status: DC

## 2020-11-07 NOTE — Progress Notes (Signed)
BH MD/PA/NP OP Progress Note Virtual Visit via Video Note  I connected with Candace King on 11/07/20 at 11:00 AM EDT by a video enabled telemedicine application and verified that I am speaking with the correct person using two identifiers.  Location: Patient: Home Provider: Clinic   I discussed the limitations of evaluation and management by telemedicine and the availability of in person appointments. The patient expressed understanding and agreed to proceed.  I provided 30 minutes of non-face-to-face time during this encounter.   11/07/2020 11:28 AM Candace King  MRN:  [id number]  Chief Complaint: "Im hanging in there. There are times when I feel down"  HPI: 20 year old female seen today for follow up psychiatric evaluation.  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  She is currently managed on Zoloft 25 mg daily.  She notes her medications are effective in managing her psychiatric conditions.    Today she is well groomed, pleasant, cooperative, and somewhat engaged in conversation. Patient affect is flat and her speech is slow (history of TBI) with decreased volume.  Today she informed Probation officer that she is hanging in there.  She notes at times she is down.  She informed Probation officer that she feels down when she thinks about her deceased loved ones.  She also notes that she has been more hesitant to hang out with her best friend because she has not been communicating with her however notes that she will be going to her baby shower tomorrow.  Provider conducted a PHQ-9 and patient scored a 9, at her last visit she scored a 15.  Provider also conducted a GAD-7 and patient scored a 4.  She endorses adequate appetite and notes that she has gained 15 pounds since her last visit.  Today she denies SI/HI/VAH, mania, or paranoia.    No  medication changes made today.  Patient agreeable to continue medications as prescribed.  She will follow-up with outpatient counseling for therapy.  No other concerns at this time Visit Diagnosis:    ICD-10-CM   1. GAD (generalized anxiety disorder)  F41.1 sertraline (ZOLOFT) 25 MG tablet    2. 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 Father Schizo-affective disorder, paternal grandmother and uncle bipolar disorder, paternal grandfather anxiety Family History:  Family History  Problem Relation Age of Onset   Anxiety disorder Mother    Hypertension Mother    Cancer Mother    Breast cancer Paternal Grandmother    Hypertension Father    Cancer Maternal Aunt    Cancer Maternal Uncle    Stroke Maternal Grandmother    Heart attack Maternal Grandmother    Hyperlipidemia Maternal Grandmother    Hyperlipidemia Maternal Grandfather    Anxiety disorder Paternal Grandfather     Social History:  Social History   Socioeconomic History   Marital status: Single    Spouse  name: Not on file   Number of children: Not on file   Years of education: Not on file   Highest education level: Not on file  Occupational History   Not on file  Tobacco Use   Smoking status: Never   Smokeless tobacco: Never  Vaping Use   Vaping Use: Never used  Substance and Sexual Activity   Alcohol use: No   Drug use: Not Currently   Sexual activity: Not Currently  Other Topics Concern    Not on file  Social History Narrative   Not on file   Social Determinants of Health   Financial Resource Strain: Not on file  Food Insecurity: Not on file  Transportation Needs: Not on file  Physical Activity: Not on file  Stress: Not on file  Social Connections: Not on file    Allergies:  Allergies  Allergen Reactions   Augmentin [Amoxicillin-Pot Clavulanate] Diarrhea   Gabapentin Other (See Comments)    hyper    Morphine And Related     Metal taste   Topiramate Other (See Comments)    Memory loss  Other reaction(s): Dizziness (intolerance) Memory loss     Metabolic Disorder Labs: Lab Results  Component Value Date   HGBA1C 4.7 (L) 07/19/2020   MPG 88 07/19/2020   No results found for: PROLACTIN Lab Results  Component Value Date   CHOL 172 07/19/2020   TRIG 79 07/19/2020   HDL 47 07/19/2020   CHOLHDL 3.7 07/19/2020   VLDL 16 07/19/2020   LDLCALC 109 (H) 07/19/2020   Lab Results  Component Value Date   TSH 0.833 07/19/2020   TSH 1.100 08/10/2019    Therapeutic Level Labs: No results found for: LITHIUM No results found for: VALPROATE No components found for:  CBMZ  Current Medications: Current Outpatient Medications  Medication Sig Dispense Refill   albuterol (PROVENTIL HFA;VENTOLIN HFA) 108 (90 Base) MCG/ACT inhaler Inhale 1-2 puffs into the lungs every 6 (six) hours as needed for wheezing or shortness of breath.     ipratropium (ATROVENT) 0.03 % nasal spray Place 1 spray into both nostrils.     Naphazoline HCl (CLEAR EYES OP) Place 1 drop into both eyes daily as needed (For dry eyes).     pantoprazole (PROTONIX) 40 MG tablet Take 1 tablet (40 mg total) by mouth 2 (two) times daily. 60 tablet 0   polyethylene glycol (MIRALAX / GLYCOLAX) 17 g packet Take 17 g by mouth daily as needed for mild constipation. 14 each 0   Probiotic Product (PROBIOTIC PO) Take 1 tablet by mouth daily.     QVAR REDIHALER 80 MCG/ACT inhaler Inhale 1 puff into the lungs 2  (two) times daily.      sertraline (ZOLOFT) 25 MG tablet Take 1 tablet (25 mg total) by mouth daily. 90 tablet 3   SUMAtriptan (IMITREX) 50 MG tablet Take 1 tablet by mouth as needed for migraine.     Vitamin D, Ergocalciferol, (DRISDOL) 1.25 MG (50000 UNIT) CAPS capsule Take 1 capsule by mouth once a week.     No current facility-administered medications for this visit.     Musculoskeletal: Strength & Muscle Tone:  Unable to assess due to telehealth visit [place]:  Unable to assess due to telehealth visit Patient leans: N/A  Psychiatric Specialty Exam: Review of Systems  There were no vitals taken for this visit.There is no height or weight on file to calculate BMI.  General Appearance: Well Groomed  Eye Contact:  Good  Speech:  Clear and Coherent and Slow  Volume:  Decreased  Mood:  Euthymic  Affect:  Appropriate and Congruent  Thought Process:  Coherent, Goal Directed, and Linear  Orientation:  Full (Time, Place, and Person)  Thought Content: WDL and Logical   Suicidal Thoughts:  No  Homicidal Thoughts:  No  Memory:  Immediate;   Good Recent;   Good Remote;   Good  Judgement:  Good  Insight:  Good  Psychomotor Activity:  Normal  Concentration:  Concentration: Good and Attention Span: Good  Recall:  Good  Fund of Knowledge: Good  Language: Good  Akathisia:  No  Handed:  Right  AIMS (if indicated): not done  Assets:  Communication Skills Desire for Improvement Financial Resources/Insurance Housing Leisure Time Social Support  ADL's:  Intact  Cognition: WNL  Sleep:  Good   Screenings: GAD-7    Flowsheet Row Video Visit from 11/07/2020 in [facility]  Total GAD-7 Score 4      PHQ2-9    Flowsheet Row Video Visit from 11/07/2020 in [facility] Video Visit from 08/12/2020 in [facility]  PHQ-2 Total Score 4 3  PHQ-9 Total Score 9 15      Flowsheet Row Video Visit  from 08/12/2020 in [facility] from 07/26/2020 in [place] [facility] from 07/19/2020 in [place] No Risk No Risk No Risk        Assessment and Plan: Patient endorses mild anxiety and depression.  At this time no medication changes made.  Patient agreeable to continue medications as prescribed.  1. GAD (generalized anxiety disorder)  Continue- sertraline (ZOLOFT) 25 MG tablet; Take 1 tablet (25 mg total) by mouth daily.  Dispense: 90 tablet; Refill: 3  2. Moderate episode of recurrent major depressive disorder (HCC)  Continue- sertraline (ZOLOFT) 25 MG tablet; Take 1 tablet (25 mg total) by mouth daily.  Dispense: 90 tablet; Refill: 3  Follow-up in 3 months  Salley Slaughter, NP 11/07/2020, 11:28 AM

## 2020-11-11 ENCOUNTER — Ambulatory Visit (HOSPITAL_COMMUNITY): Payer: Self-pay | Admitting: Licensed Clinical Social Worker

## 2020-12-21 ENCOUNTER — Ambulatory Visit (HOSPITAL_COMMUNITY): Payer: Medicaid Other | Admitting: Licensed Clinical Social Worker

## 2021-02-08 ENCOUNTER — Telehealth (INDEPENDENT_AMBULATORY_CARE_PROVIDER_SITE_OTHER): Payer: Medicaid Other | Admitting: Psychiatry

## 2021-02-08 ENCOUNTER — Encounter (HOSPITAL_COMMUNITY): Payer: Self-pay | Admitting: Psychiatry

## 2021-02-08 DIAGNOSIS — F32A Depression, unspecified: Secondary | ICD-10-CM | POA: Diagnosis not present

## 2021-02-08 DIAGNOSIS — F411 Generalized anxiety disorder: Secondary | ICD-10-CM

## 2021-02-08 MED ORDER — SERTRALINE HCL 25 MG PO TABS: 25.0000 mg | ORAL_TABLET | Freq: Every day | ORAL | 3 refills | Status: DC

## 2021-02-08 NOTE — Progress Notes (Signed)
BH MD/PA/NP OP Progress Note Virtual Visit via Video Note  I connected with Candace King on 02/08/21 at 11:00 AM EST by a video enabled telemedicine application and verified that I am speaking with the correct person using two identifiers.  Location: Patient: Home Provider: Clinic   I discussed the limitations of evaluation and management by telemedicine and the availability of in person appointments. The patient expressed understanding and agreed to proceed.  I provided 30 minutes of non-face-to-face time during this encounter.   02/08/2021 11:17 AM Candace King  MRN:  [id number]  Chief Complaint: "I feel a little anxious at times"  HPI: 21 year old female seen today for follow up psychiatric evaluation.  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  She is currently managed on Zoloft 25 mg daily.  She notes her medications are effective in managing her psychiatric conditions.    Today she is well groomed, pleasant, cooperative, and somewhat engaged in conversation. Patient affect is flat and her speech is slow (history of TBI) with decreased volume.  Today she informed writer that at times she feels a little anxious.  She informed Probation officer however that she is able to cope with it by breathing exercises and listening to music.  Patient notes that her anxiety overall and depression continues to be minimal.  Provider conducted a GAD-7 and patient scored a 5, at her last visit she scored a 4.  Provider also conducted PHQ-9 and patient scored forms, at her last visit she scored a 9.  She endorses adequate sleep and appetite.  Today she denies SI/HI/AVH, mania, paranoia.    No medication changes made today.  Patient agreeable to continue medications as prescribed.  She will follow-up with outpatient counseling for therapy.  No other  concerns at this time Visit Diagnosis:    ICD-10-CM   1. GAD (generalized anxiety disorder)  F41.1 sertraline (ZOLOFT) 25 MG tablet    2. 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 Father Schizo-affective disorder, paternal grandmother and uncle bipolar disorder, paternal grandfather anxiety Family History:  Family History  Problem Relation Age of Onset   Anxiety disorder Mother    Hypertension Mother    Cancer Mother    Breast cancer Paternal Grandmother    Hypertension Father    Cancer Maternal Aunt    Cancer Maternal Uncle    Stroke Maternal Grandmother    Heart attack Maternal Grandmother    Hyperlipidemia Maternal Grandmother    Hyperlipidemia Maternal Grandfather    Anxiety disorder Paternal Grandfather     Social History:  Social History   Socioeconomic History   Marital status: Single    Spouse name: Not on file   Number of children: Not on file   Years of education: Not on file   Highest education level: Not on file  Occupational History   Not on  file  Tobacco Use   Smoking status: Never   Smokeless tobacco: Never  Vaping Use   Vaping Use: Never used  Substance and Sexual Activity   Alcohol use: No   Drug use: Not Currently   Sexual activity: Not Currently  Other Topics Concern   Not on file  Social History Narrative   Not on file   Social Determinants of Health   Financial Resource Strain: Not on file  Food Insecurity: Not on  file  Transportation Needs: Not on file  Physical Activity: Not on file  Stress: Not on file  Social Connections: Not on file    Allergies:  Allergies  Allergen Reactions   Augmentin [Amoxicillin-Pot Clavulanate] Diarrhea   Gabapentin Other (See Comments)    hyper    Morphine And Related     Metal taste   Topiramate Other (See Comments)    Memory loss  Other reaction(s): Dizziness (intolerance) Memory loss     Metabolic Disorder Labs: Lab Results  Component Value Date   HGBA1C 4.7 (L) 07/19/2020   MPG 88 07/19/2020   No results found for: PROLACTIN Lab Results  Component Value Date   CHOL 172 07/19/2020   TRIG 79 07/19/2020   HDL 47 07/19/2020   CHOLHDL 3.7 07/19/2020   VLDL 16 07/19/2020   LDLCALC 109 (H) 07/19/2020   Lab Results  Component Value Date   TSH 0.833 07/19/2020   TSH 1.100 08/10/2019    Therapeutic Level Labs: No results found for: LITHIUM No results found for: VALPROATE No components found for:  CBMZ  Current Medications: Current Outpatient Medications  Medication Sig Dispense Refill   albuterol (PROVENTIL HFA;VENTOLIN HFA) 108 (90 Base) MCG/ACT inhaler Inhale 1-2 puffs into the lungs every 6 (six) hours as needed for wheezing or shortness of breath.     ipratropium (ATROVENT) 0.03 % nasal spray Place 1 spray into both nostrils.     Naphazoline HCl (CLEAR EYES OP) Place 1 drop into both eyes daily as needed (For dry eyes).     pantoprazole (PROTONIX) 40 MG tablet Take 1 tablet (40 mg total) by mouth 2 (two) times daily. 60 tablet 0   polyethylene glycol (MIRALAX / GLYCOLAX) 17 g packet Take 17 g by mouth daily as needed for mild constipation. 14 each 0   Probiotic Product (PROBIOTIC PO) Take 1 tablet by mouth daily.     QVAR REDIHALER 80 MCG/ACT inhaler Inhale 1 puff into the lungs 2 (two) times daily.      sertraline (ZOLOFT) 25 MG tablet Take 1 tablet (25 mg total) by mouth daily. 90 tablet 3   SUMAtriptan (IMITREX) 50 MG tablet Take 1  tablet by mouth as needed for migraine.     Vitamin D, Ergocalciferol, (DRISDOL) 1.25 MG (50000 UNIT) CAPS capsule Take 1 capsule by mouth once a week.     No current facility-administered medications for this visit.     Musculoskeletal: Strength & Muscle Tone:  Unable to assess due to telehealth visit [place]:  Unable to assess due to telehealth visit Patient leans: N/A  Psychiatric Specialty Exam: Review of Systems  There were no vitals taken for this visit.There is no height or weight on file to calculate BMI.  General Appearance: Well Groomed  Eye Contact:  Good  Speech:  Clear and Coherent and Slow  Volume:  Decreased  Mood:  Euthymic  Affect:  Appropriate and Congruent  Thought Process:  Coherent, Goal Directed, and Linear  Orientation:  Full (  Time, Place, and Person)  Thought Content: WDL and Logical   Suicidal Thoughts:  No  Homicidal Thoughts:  No  Memory:  Immediate;   Good Recent;   Good Remote;   Good  Judgement:  Good  Insight:  Good  Psychomotor Activity:  Normal  Concentration:  Concentration: Good and Attention Span: Good  Recall:  Good  Fund of Knowledge: Good  Language: Good  Akathisia:  No  Handed:  Right  AIMS (if indicated): not done  Assets:  Communication Skills Desire for Improvement Financial Resources/Insurance Housing Leisure Time Social Support  ADL's:  Intact  Cognition: WNL  Sleep:  Good   Screenings: GAD-7    Flowsheet Row Video Visit from 02/08/2021 in [facility] Video Visit from 11/07/2020 in [facility]  Total GAD-7 Score 5 4      PHQ2-9    Flowsheet Row Video Visit from 02/08/2021 in [facility] Video Visit from 11/07/2020 in [facility] Video Visit from 08/12/2020 in [place]  PHQ-2 Total Score 2 4 3   PHQ-9 Total Score 4 9 15       Flowsheet Row Video Visit from 08/12/2020  in [facility] from 07/26/2020 in [place] [facility] from 07/19/2020 in [place] No Risk No Risk No Risk        Assessment and Plan: Patient endorses mild anxiety which she reports she is able to cope with with breathing exercises and music.  No medication changes made today.  Patient agreeable to continue medication as prescribed. 1. GAD (generalized anxiety disorder)  Continue- sertraline (ZOLOFT) 25 MG tablet; Take 1 tablet (25 mg total) by mouth daily.  Dispense: 90 tablet; Refill: 3  2. Mild depression  Continue- sertraline (ZOLOFT) 25 MG tablet; Take 1 tablet (25 mg total) by mouth daily.  Dispense: 90 tablet; Refill: 3  Follow-up in 3 months  Salley Slaughter, NP 02/08/2021, 11:17 AM

## 2021-02-28 ENCOUNTER — Ambulatory Visit (HOSPITAL_COMMUNITY): Payer: Medicaid Other | Admitting: Clinical

## 2021-04-25 ENCOUNTER — Telehealth (INDEPENDENT_AMBULATORY_CARE_PROVIDER_SITE_OTHER): Payer: Medicaid Other | Admitting: Psychiatry

## 2021-04-25 DIAGNOSIS — F411 Generalized anxiety disorder: Secondary | ICD-10-CM

## 2021-04-25 DIAGNOSIS — F32A Depression, unspecified: Secondary | ICD-10-CM

## 2021-04-25 MED ORDER — SERTRALINE HCL 25 MG PO TABS: 25.0000 mg | ORAL_TABLET | Freq: Every day | ORAL | 3 refills | Status: DC

## 2021-04-25 NOTE — Progress Notes (Signed)
BH MD/PA/NP OP Progress Note ? ?04/25/2021 1:53 PM ?Candace King  ?MRN:  [id number] ? ?Virtual Visit via Video Note ? ?I connected with Candace King on 04/25/21 at 11:30 AM EDT by a video enabled telemedicine application and verified that I am speaking with the correct person using two identifiers. ? ?Location: ?Patient: Home ?Provider: Offsite ?  ?I discussed the limitations of evaluation and management by telemedicine and the availability of in person appointments. The patient expressed understanding and agreed to proceed. ? ?  ?I discussed the assessment and treatment plan with the patient. The patient was provided an opportunity to ask questions and all were answered. The patient agreed with the plan and demonstrated an understanding of the instructions. ?  ?The patient was advised to call back or seek an in-person evaluation if the symptoms worsen or if the condition fails to improve as anticipated. ? ?I provided 5 minutes of non-face-to-face time during this encounter. ? ? ?Franne Grip, NP  ? ?Chief Complaint: Medication management ? ?HPI: Candace King is a 21 year old female presenting to [facility] behavioral health outpatient for follow-up psychiatric evaluation.  She has a psychiatric history of generalized anxiety disorder and mild depression.  Her symptoms are managed with sertraline 25 mg daily.  Patient reports medications are effective and medication compliance.  Patient denies adverse medication effects or the need for dosage adjustment today.  No medication changes today. ?Patient is alert and oriented x4, calm pleasant and willing to engage.  She reports a good mood, sleep and appetite.  Patient denies suicidal or homicidal ideations, paranoia, delusional thought, auditory or visual hallucinations. ? ?Visit Diagnosis:  ?  ICD-10-CM   ?1. Mild depression  F32.A   ?  ?2. GAD (generalized anxiety disorder)  F41.1   ?  ? ? ?Past Psychiatric History: Mild depression and generalized anxiety  disorder ? ?Past Medical History:  ?Past Medical History:  ?Diagnosis Date  ? Asthma   ? Telangiectasia   ?  ?Past Surgical History:  ?Procedure Laterality Date  ? TONSILLECTOMY    ? ? ?Family Psychiatric History: None known ? ?Family History:  ?Family History  ?Problem Relation Age of Onset  ? Anxiety disorder Mother   ? Hypertension Mother   ? Cancer Mother   ? Breast cancer Paternal Grandmother   ? Hypertension Father   ? Cancer Maternal Aunt   ? Cancer Maternal Uncle   ? Stroke Maternal Grandmother   ? Heart attack Maternal Grandmother   ? Hyperlipidemia Maternal Grandmother   ? Hyperlipidemia Maternal Grandfather   ? Anxiety disorder Paternal Grandfather   ? ? ?Social History:  ?Social History  ? ?Socioeconomic History  ? Marital status: Single  ?  Spouse name: Not on file  ? Number of children: Not on file  ? Years of education: Not on file  ? Highest education level: Not on file  ?Occupational History  ? Not on file  ?Tobacco Use  ? Smoking status: Never  ? Smokeless tobacco: Never  ?Vaping Use  ? Vaping Use: Never used  ?Substance and Sexual Activity  ? Alcohol use: No  ? Drug use: Not Currently  ? Sexual activity: Not Currently  ?Other Topics Concern  ? Not on file  ?Social History Narrative  ? Not on file  ? ?Social Determinants of Health  ? ?Financial Resource Strain: Not on file  ?Food Insecurity: Not on file  ?Transportation Needs: Not on file  ?Physical Activity: Not on file  ?Stress: Not  on file  ?Social Connections: Not on file  ? ? ?Allergies:  ?Allergies  ?Allergen Reactions  ? Augmentin [Amoxicillin-Pot Clavulanate] Diarrhea  ? Gabapentin Other (See Comments)  ?  hyper ?  ? Morphine And Related   ?  Metal taste  ? Topiramate Other (See Comments)  ?  Memory loss  ?Other reaction(s): Dizziness (intolerance) ?Memory loss   ? ? ?Metabolic Disorder Labs: ?Lab Results  ?Component Value Date  ? HGBA1C 4.7 (L) 07/19/2020  ? MPG 88 07/19/2020  ? ?No results found for: PROLACTIN ?Lab Results  ?Component  Value Date  ? CHOL 172 07/19/2020  ? TRIG 79 07/19/2020  ? HDL 47 07/19/2020  ? CHOLHDL 3.7 07/19/2020  ? VLDL 16 07/19/2020  ? LDLCALC 109 (H) 07/19/2020  ? ?Lab Results  ?Component Value Date  ? TSH 0.833 07/19/2020  ? TSH 1.100 08/10/2019  ? ? ?Therapeutic Level Labs: ?No results found for: LITHIUM ?No results found for: VALPROATE ?No components found for:  CBMZ ? ?Current Medications: ?Current Outpatient Medications  ?Medication Sig Dispense Refill  ? albuterol (PROVENTIL HFA;VENTOLIN HFA) 108 (90 Base) MCG/ACT inhaler Inhale 1-2 puffs into the lungs every 6 (six) hours as needed for wheezing or shortness of breath.    ? ipratropium (ATROVENT) 0.03 % nasal spray Place 1 spray into both nostrils.    ? Naphazoline HCl (CLEAR EYES OP) Place 1 drop into both eyes daily as needed (For dry eyes).    ? pantoprazole (PROTONIX) 40 MG tablet Take 1 tablet (40 mg total) by mouth 2 (two) times daily. 60 tablet 0  ? polyethylene glycol (MIRALAX / GLYCOLAX) 17 g packet Take 17 g by mouth daily as needed for mild constipation. 14 each 0  ? Probiotic Product (PROBIOTIC PO) Take 1 tablet by mouth daily.    ? QVAR REDIHALER 80 MCG/ACT inhaler Inhale 1 puff into the lungs 2 (two) times daily.     ? sertraline (ZOLOFT) 25 MG tablet Take 1 tablet (25 mg total) by mouth daily. 90 tablet 3  ? SUMAtriptan (IMITREX) 50 MG tablet Take 1 tablet by mouth as needed for migraine.    ? Vitamin D, Ergocalciferol, (DRISDOL) 1.25 MG (50000 UNIT) CAPS capsule Take 1 capsule by mouth once a week.    ? ?No current facility-administered medications for this visit.  ? ? ? ?Musculoskeletal: ?Strength & Muscle Tone: N/A virtual visit ?Gait & Station: N/A virtual visit ?Patient leans: N/A ? ?Psychiatric Specialty Exam: ?Review of Systems  ?Psychiatric/Behavioral:  Negative for hallucinations, self-injury and suicidal ideas.   ?All other systems reviewed and are negative.  ?There were no vitals taken for this visit.There is no height or weight on file to  calculate BMI.  ?General Appearance: Well Groomed  ?Eye Contact:  Good  ?Speech:  Clear and Coherent  ?Volume:  Normal  ?Mood:  Euthymic  ?Affect:  Congruent  ?Thought Process:  Goal Directed  ?Orientation:  Full (Time, Place, and Person)  ?Thought Content: Logical   ?Suicidal Thoughts:  No  ?Homicidal Thoughts:  No  ?Memory: Good  ?Judgement:  Good  ?Insight:  Good  ?Psychomotor Activity:  NA  ?Concentration: Good  ?Recall:  Good  ?Fund of Knowledge: Good  ?Language: Good  ?Akathisia:  NA  ?Handed:  Right  ?AIMS (if indicated): not done  ?Assets:  Communication Skills ?Desire for Improvement  ?ADL's:  Intact  ?Cognition: WNL  ?Sleep:  Good  ? ?Screenings: ?GAD-7   ? ?Flowsheet Row Video Visit from 02/08/2021  in [facility] Video Visit from 11/07/2020 in [facility]  ?Total GAD-7 Score 5 4  ? ?  ? ?PHQ2-9   ? ?Flowsheet Row Video Visit from 02/08/2021 in [facility] Video Visit from 11/07/2020 in [facility] Video Visit from 08/12/2020 in [facility]  ?PHQ-2 Total Score '2 4 3  '$ ?PHQ-9 Total Score '4 9 15  '$ ? ?  ? ?Flowsheet Row Video Visit from 08/12/2020 in [facility] ED from 07/26/2020 in [place] ED from 07/19/2020 in [facility]  ?C-SSRS RISK CATEGORY No Risk No Risk No Risk  ? ?  ? ? ? ?Assessment and Plan: Tomeko Scoville is a 21 year old female presenting to [facility] behavioral health outpatient for follow-up psychiatric evaluation.  She has a psychiatric history of generalized anxiety disorder and mild depression.  Her symptoms are managed with sertraline 25 mg daily.  Patient reports medications are effective and medication compliance.  Patient denies adverse medication effects or the need for dosage adjustment today.  No medication changes today.  Medication refilled at  current dosage. ? ?Collaboration of Care: Collaboration of Care: Medication Management AEB medications E scribed to patient's preferred pharmacy ? ?Return to care in 3 months ? ?Patient/Guardian was advised

## 2021-05-08 ENCOUNTER — Telehealth (HOSPITAL_COMMUNITY): Payer: Self-pay | Admitting: *Deleted

## 2021-05-08 NOTE — Telephone Encounter (Signed)
[place] ?HAS SCHEDULED APPOINTMENT WITH WITH CLIENT ON 05/10/21 @ 2:00 PM ?

## 2021-07-17 ENCOUNTER — Encounter (HOSPITAL_COMMUNITY): Payer: Self-pay | Admitting: Psychiatry

## 2021-07-17 ENCOUNTER — Telehealth (INDEPENDENT_AMBULATORY_CARE_PROVIDER_SITE_OTHER): Payer: Medicaid Other | Admitting: Psychiatry

## 2021-07-17 DIAGNOSIS — F902 Attention-deficit hyperactivity disorder, combined type: Secondary | ICD-10-CM

## 2021-07-17 DIAGNOSIS — F32A Depression, unspecified: Secondary | ICD-10-CM | POA: Diagnosis not present

## 2021-07-17 DIAGNOSIS — F411 Generalized anxiety disorder: Secondary | ICD-10-CM

## 2021-07-17 MED ORDER — SERTRALINE HCL 25 MG PO TABS: 25.0000 mg | ORAL_TABLET | Freq: Every day | ORAL | 3 refills | Status: DC

## 2021-07-17 NOTE — Progress Notes (Signed)
[place] MD/PA/NP OP Progress Note Virtual Visit via Video Note  I connected with Candace King on 07/17/21 at  2:30 PM EDT by a video enabled telemedicine application and verified that I am speaking with the correct person using two identifiers.  Location: Patient: Home Provider: Clinic   I discussed the limitations of evaluation and management by telemedicine and the availability of in person appointments. The patient expressed understanding and agreed to proceed.  I provided 30 minutes of non-face-to-face time during this encounter.   07/17/2021 3:14 PM Candace King  MRN:  [id number]  Chief Complaint: "I feel a lot better"  HPI: 21 year old female seen today for follow up psychiatric evaluation.  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  She is currently managed on Zoloft 25 mg daily.  She notes her medications are effective in managing her psychiatric conditions.    Today she is well groomed, pleasant, cooperative, and somewhat engaged in conversation. Patient informed Probation officer that she is feeling a lot better. She notes that she has been active and spending time with her family. Patient notes that she has been on a positive and confident. She notes that she went to the [employer] center to learn how to apply for jobs and set up a resume. She informed Probation officer that she also saw Dr. Lynnette Caffey at [facility] and feel that her diagnosis of Autism and ADHD explains a lot. She notes that she is now embracing her new normal and is looking forward to the future.  She reports her anxiety and depression continue to be minimal.  Provider conducted a GAD-7 and patient scored a 4.  Provider also conducted PHQ-9 and patient scored a 3.  She endorsed adequate sleep and appetite.  Today she denies SI/HI/AVH, mania, paranoia.    Patient informed Probation officer  that she is considering discontinuing Zoloft.  Provider informed patient that she is on a low-dose and can discontinue the medication when she wishes.  Provider also informed her that if she would like to taper she can cut her pill in half for a week prior to discontinuing.  She endorsed understanding and agreed.  She also informed Probation officer that she may be interested in starting a medication to help manage her ADHD but notes that she would like to discuss this with the provider at [facility].  Provider endorsed understanding and agreed.rovider asked patient to have a copy sent over medical records.  She endorsed understanding and agreed.   She will follow-up with outpatient counseling for therapy.  No other concerns at this time.   Visit Diagnosis:    ICD-10-CM   1. Attention deficit hyperactivity disorder (ADHD), combined type  F90.2     2. Mild depression  F32.A sertraline (ZOLOFT) 25 MG tablet    3. 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 Father Schizo-affective disorder, paternal grandmother and uncle bipolar disorder, paternal  grandfather anxiety Family History:  Family History  Problem Relation Age of Onset   Anxiety disorder Mother    Hypertension Mother    Cancer Mother    Breast cancer Paternal Grandmother    Hypertension Father    Cancer Maternal Aunt    Cancer Maternal Uncle    Stroke Maternal Grandmother    Heart attack Maternal Grandmother    Hyperlipidemia Maternal Grandmother     Hyperlipidemia Maternal Grandfather    Anxiety disorder Paternal Grandfather     Social History:  Social History   Socioeconomic History   Marital status: Single    Spouse name: Not on file   Number of children: Not on file   Years of education: Not on file   Highest education level: Not on file  Occupational History   Not on file  Tobacco Use   Smoking status: Never   Smokeless tobacco: Never  Vaping Use   Vaping Use: Never used  Substance and Sexual Activity   Alcohol use: No   Drug use: Not Currently   Sexual activity: Not Currently  Other Topics Concern   Not on file  Social History Narrative   Not on file   Social Determinants of Health   Financial Resource Strain: Not on file  Food Insecurity: Not on file  Transportation Needs: Not on file  Physical Activity: Not on file  Stress: Not on file  Social Connections: Not on file    Allergies:  Allergies  Allergen Reactions   Augmentin [Amoxicillin-Pot Clavulanate] Diarrhea   Gabapentin Other (See Comments)    hyper    Morphine And Related     Metal taste   Topiramate Other (See Comments)    Memory loss  Other reaction(s): Dizziness (intolerance) Memory loss     Metabolic Disorder Labs: Lab Results  Component Value Date   HGBA1C 4.7 (L) 07/19/2020   MPG 88 07/19/2020   No results found for: "PROLACTIN" Lab Results  Component Value Date   CHOL 172 07/19/2020   TRIG 79 07/19/2020   HDL 47 07/19/2020   CHOLHDL 3.7 07/19/2020   VLDL 16 07/19/2020   LDLCALC 109 (H) 07/19/2020   Lab Results  Component Value Date   TSH 0.833 07/19/2020   TSH 1.100 08/10/2019    Therapeutic Level Labs: No results found for: "LITHIUM" No results found for: "VALPROATE" No results found for: "CBMZ"  Current Medications: Current Outpatient Medications  Medication Sig Dispense Refill   albuterol (PROVENTIL HFA;VENTOLIN HFA) 108 (90 Base) MCG/ACT inhaler Inhale 1-2 puffs into the lungs every 6 (six) hours as  needed for wheezing or shortness of breath.     ipratropium (ATROVENT) 0.03 % nasal spray Place 1 spray into both nostrils.     Naphazoline HCl (CLEAR EYES OP) Place 1 drop into both eyes daily as needed (For dry eyes).     pantoprazole (PROTONIX) 40 MG tablet Take 1 tablet (40 mg total) by mouth 2 (two) times daily. 60 tablet 0   polyethylene glycol (MIRALAX / GLYCOLAX) 17 g packet Take 17 g by mouth daily as needed for mild constipation. 14 each 0   Probiotic Product (PROBIOTIC PO) Take 1 tablet by mouth daily.     QVAR REDIHALER 80 MCG/ACT inhaler Inhale 1 puff into the lungs 2 (two) times daily.      sertraline (ZOLOFT) 25 MG tablet Take 1 tablet (25 mg total) by mouth daily. 30 tablet 3   SUMAtriptan (IMITREX) 50 MG tablet Take 1 tablet by  mouth as needed for migraine.     Vitamin D, Ergocalciferol, (DRISDOL) 1.25 MG (50000 UNIT) CAPS capsule Take 1 capsule by mouth once a week.     No current facility-administered medications for this visit.     Musculoskeletal: Strength & Muscle Tone:  Unable to assess due to telehealth visit [place]:  Unable to assess due to telehealth visit Patient leans: N/A  Psychiatric Specialty Exam: Review of Systems  There were no vitals taken for this visit.There is no height or weight on file to calculate BMI.  General Appearance: Well Groomed  Eye Contact:  Good  Speech:  Clear and Coherent and Slow  Volume:  Decreased  Mood:  Euthymic  Affect:  Appropriate and Congruent  Thought Process:  Coherent, Goal Directed, and Linear  Orientation:  Full (Time, Place, and Person)  Thought Content: WDL and Logical   Suicidal Thoughts:  No  Homicidal Thoughts:  No  Memory:  Immediate;   Good Recent;   Good Remote;   Good  Judgement:  Good  Insight:  Good  Psychomotor Activity:  Normal  Concentration:  Concentration: Good and Attention Span: Good  Recall:  Good  Fund of Knowledge: Good  Language: Good  Akathisia:  No  Handed:  Right  AIMS (if  indicated): not done  Assets:  Communication Skills Desire for Improvement Financial Resources/Insurance Housing Leisure Time Social Support  ADL's:  Intact  Cognition: WNL  Sleep:  Good   Screenings: GAD-7    Flowsheet Row Video Visit from 07/17/2021 in [facility] Video Visit from 02/08/2021 in [facility] Video Visit from 11/07/2020 in [facility]  Total GAD-7 Score '4 5 4      '$ PHQ2-9    Flowsheet Row Video Visit from 07/17/2021 in [facility] Video Visit from 02/08/2021 in [facility] Video Visit from 11/07/2020 in [facility] Video Visit from 08/12/2020 in [facility]  PHQ-2 Total Score 0 '2 4 3  '$ PHQ-9 Total Score '3 4 9 15      '$ Flowsheet Row Video Visit from 08/12/2020 in [facility] ED from 07/26/2020 in [place] ED from 07/19/2020 in [place] No Risk No Risk No Risk        Assessment and Plan: Patient endorses symptoms of ADHD.  At this time she does not want to start medications to help with ADHD as she will discuss treatment with provider at [facility].  Her anxiety and depression are well managed.  Provider asked patient to have a copy sent over medical records.  She endorsed understanding and agreed.  No medication changes made today.  Patient agreeable to continue medication as prescribed. 1. GAD (generalized anxiety disorder)  Continue- sertraline (ZOLOFT) 25 MG tablet; Take 1 tablet (25 mg total) by mouth daily.  Dispense: 90 tablet; Refill: 3  2. Mild depression  Continue- sertraline (ZOLOFT) 25 MG tablet; Take 1 tablet (25 mg total) by mouth daily.  Dispense: 90 tablet; Refill: 3  3. Attention deficit hyperactivity disorder (ADHD), combined  type   Follow-up in 3 months  Salley Slaughter, NP 07/17/2021, 3:14 PM

## 2021-10-11 ENCOUNTER — Telehealth (HOSPITAL_COMMUNITY): Payer: Self-pay | Admitting: Psychiatry

## 2021-10-12 NOTE — Telephone Encounter (Signed)
Provider attempted to call the patient 5 times without success.  Provider left voicemail informing patient of walk-in hours.  No other concerns at this time.

## 2021-12-14 ENCOUNTER — Encounter (HOSPITAL_COMMUNITY): Payer: Self-pay

## 2021-12-14 ENCOUNTER — Telehealth (HOSPITAL_COMMUNITY): Payer: Medicaid Other | Admitting: Psychiatry

## 2022-01-19 ENCOUNTER — Encounter (HOSPITAL_COMMUNITY): Payer: Self-pay | Admitting: Psychiatry

## 2022-01-19 ENCOUNTER — Telehealth (INDEPENDENT_AMBULATORY_CARE_PROVIDER_SITE_OTHER): Payer: Medicaid Other | Admitting: Psychiatry

## 2022-01-19 DIAGNOSIS — F902 Attention-deficit hyperactivity disorder, combined type: Secondary | ICD-10-CM

## 2022-01-19 DIAGNOSIS — F411 Generalized anxiety disorder: Secondary | ICD-10-CM | POA: Diagnosis not present

## 2022-01-19 DIAGNOSIS — F32A Depression, unspecified: Secondary | ICD-10-CM

## 2022-01-19 MED ORDER — ATOMOXETINE HCL 25 MG PO CAPS: 25.0000 mg | ORAL_CAPSULE | Freq: Every day | ORAL | 3 refills | Status: DC

## 2022-01-19 MED ORDER — BUSPIRONE HCL 5 MG PO TABS: 5.0000 mg | ORAL_TABLET | Freq: Three times a day (TID) | ORAL | 3 refills | Status: DC

## 2022-01-19 MED ORDER — ATOMOXETINE HCL 40 MG PO CAPS: 40.0000 mg | ORAL_CAPSULE | Freq: Every day | ORAL | 3 refills | Status: DC

## 2022-01-19 MED ORDER — SERTRALINE HCL 25 MG PO TABS: 75.0000 mg | ORAL_TABLET | Freq: Every day | ORAL | 3 refills | Status: DC

## 2022-01-19 NOTE — Progress Notes (Signed)
BH MD/PA/NP OP Progress Note Virtual Visit via Telephone Note  I connected with Candace King on 01/19/22 at 11:30 AM EST by telephone and verified that I am speaking with the correct person using two identifiers.  Location: Patient: home Provider: Clinic   I discussed the limitations, risks, security and privacy concerns of performing an evaluation and management service by telephone and the availability of in person appointments. I also discussed with the patient that there may be a patient responsible charge related to this service. The patient expressed understanding and agreed to proceed.   I provided 30 minutes of non-face-to-face time during this encounter.    01/19/2022 6:16 PM Candace King  MRN:  [id number]  Chief Complaint: "There has been a lot of changes"  HPI: 21 year old female seen today for follow up psychiatric evaluation.  She has a psychiatric history of acute psychosis, anxiety, depression,  social processing disorder (provider informed of this by mother), central auditory disorder (provider informed of this by mother), functional neurologic syndrome, Autism, and unspecified mood disorder.  Recently patient patient had a catatonic episode. She was placed in [facility] for two weeks after being released from the hospital. Her medications were adjusted and now she is currently managed on Zoloft 75 mg daily and Buspar 5 mg three times daily.  She notes her medications are somewhat effective in managing her psychiatric conditions.    Today she was unable to login virtually so her assessment was done over the phone. During exam she notes that a lot has changed. Patient mother notes that in Aug/Sep patient had an intense fear that her father was near. Her mother notes that this fear lead to bizarre  behaviors,  urinary incontinence, a catatonic episode, and hospitalization. After her hospitalization she was transferred to [facility] rehab where she stay for 2 weeks.   Patient's mother notes that at times she continues to have urinary incontinence but is doing somewhat better. She informed Probation officer that they continue to taper off of Ativan.  Patient informed writer that since her hospitalization she has been somewhat better. Today provider conducted a GAD 7 and patient scored a 5, at her last visit she scored a 4. Provider also conducted a PHQ 9 and patient scored a 13, at her last visit she scored a 3. She endorses fluctuations in sleep (5-11 hours). She reports that she has an adequate appetite. Today she denies SI/HI/VHA mania or paranoia.    Today patient notes that she continues to have poor concentration, poor listening skills, inattentiveness to mentally taxing task, and forgetfulness. Patient notes that she is interested in starting a medication to help manage these symptoms. Today Strattera 25 mg daily started. Potential side effects of medication and risks vs benefits of treatment vs non-treatment were explained and discussed. All questions were answered. Patient will continue all other medications as prescribed. She will follow-up with outpatient counseling for therapy.  No other concerns at this time.   Visit Diagnosis:    ICD-10-CM   1. Attention deficit hyperactivity disorder (ADHD), combined type  F90.2 atomoxetine (STRATTERA) 25 MG capsule    DISCONTINUED: atomoxetine (STRATTERA) 40 MG capsule    2. GAD (generalized anxiety disorder)  F41.1 busPIRone (BUSPAR) 5 MG tablet    sertraline (ZOLOFT) 25 MG tablet    3. Mild depression  F32.A busPIRone (BUSPAR) 5 MG tablet    sertraline (ZOLOFT) 25 MG tablet      Past Psychiatric History: acute psychosis, anxiety, depression,  social processing disorder (provider informed of this by mother), central auditory disorder (provider informed of this by mother), functional neurologic syndrome, Autism, and unspecified mood disorder.  Past Medical History:  Past Medical History:  Diagnosis Date   Asthma     Telangiectasia     Past Surgical History:  Procedure Laterality Date   TONSILLECTOMY      Family Psychiatric History:  Maternal Family depression and anxiety. Father Schizo-affective disorder, paternal grandmother and uncle bipolar disorder, paternal grandfather anxiety Family History:  Family History  Problem Relation Age of Onset   Anxiety disorder Mother    Hypertension Mother    Cancer Mother    Breast cancer Paternal Grandmother    Hypertension Father    Cancer Maternal Aunt    Cancer Maternal Uncle    Stroke Maternal Grandmother    Heart attack Maternal Grandmother    Hyperlipidemia Maternal Grandmother    Hyperlipidemia Maternal Grandfather    Anxiety disorder Paternal Grandfather     Social History:  Social History   Socioeconomic History   Marital status: Single    Spouse name: Not on file   Number of children: Not on file   Years of education: Not on file   Highest education level: Not on file  Occupational History   Not on file  Tobacco Use   Smoking status: Never   Smokeless tobacco: Never  Vaping Use   Vaping Use: Never used  Substance and Sexual Activity   Alcohol use: No   Drug use: Not Currently   Sexual activity: Not Currently  Other Topics Concern   Not on file  Social History Narrative   Not on file   Social Determinants of Health   Financial Resource Strain: Not on file  Food Insecurity: Not on file  Transportation Needs: Not on file  Physical Activity: Not on file  Stress: Not on file  Social Connections: Not on file    Allergies:  Allergies  Allergen Reactions   Augmentin [Amoxicillin-Pot Clavulanate] Diarrhea   Gabapentin Other (See Comments)    hyper    Morphine And Related     Metal taste   Topiramate Other (See Comments)    Memory loss  Other reaction(s): Dizziness (intolerance) Memory loss     Metabolic Disorder Labs: Lab Results  Component Value Date   HGBA1C 4.7 (L) 07/19/2020   MPG 88 07/19/2020   No  results found for: "PROLACTIN" Lab Results  Component Value Date   CHOL 172 07/19/2020   TRIG 79 07/19/2020   HDL 47 07/19/2020   CHOLHDL 3.7 07/19/2020   VLDL 16 07/19/2020   LDLCALC 109 (H) 07/19/2020   Lab Results  Component Value Date   TSH 0.833 07/19/2020   TSH 1.100 08/10/2019    Therapeutic Level Labs: No results found for: "LITHIUM" No results found for: "VALPROATE" No results found for: "CBMZ"  Current Medications: Current Outpatient Medications  Medication Sig Dispense Refill   busPIRone (BUSPAR) 5 MG tablet Take 1 tablet (5 mg total) by mouth 3 (three) times daily. 90 tablet 3   albuterol (PROVENTIL HFA;VENTOLIN HFA) 108 (90 Base) MCG/ACT inhaler Inhale 1-2 puffs into the lungs every 6 (six) hours as needed for wheezing or shortness of breath.     atomoxetine (STRATTERA) 25 MG capsule Take 1 capsule (25 mg total) by mouth daily. 30 capsule 3   ipratropium (ATROVENT) 0.03 % nasal spray Place 1 spray into both nostrils.     Naphazoline HCl (CLEAR EYES OP) Place  1 drop into both eyes daily as needed (For dry eyes).     pantoprazole (PROTONIX) 40 MG tablet Take 1 tablet (40 mg total) by mouth 2 (two) times daily. 60 tablet 0   polyethylene glycol (MIRALAX / GLYCOLAX) 17 g packet Take 17 g by mouth daily as needed for mild constipation. 14 each 0   Probiotic Product (PROBIOTIC PO) Take 1 tablet by mouth daily.     QVAR REDIHALER 80 MCG/ACT inhaler Inhale 1 puff into the lungs 2 (two) times daily.      sertraline (ZOLOFT) 25 MG tablet Take 3 tablets (75 mg total) by mouth daily. 45 tablet 3   SUMAtriptan (IMITREX) 50 MG tablet Take 1 tablet by mouth as needed for migraine.     Vitamin D, Ergocalciferol, (DRISDOL) 1.25 MG (50000 UNIT) CAPS capsule Take 1 capsule by mouth once a week.     No current facility-administered medications for this visit.     Musculoskeletal: Strength & Muscle Tone:  Unable to assess due to telephone visit Gait & Station:  Unable to assess due  to telephone visit Patient leans: N/A  Psychiatric Specialty Exam: Review of Systems  There were no vitals taken for this visit.There is no height or weight on file to calculate BMI.  General Appearance:  Unable to assess due to telephone visit  Eye Contact:   Unable to assess due to telephone visit  Speech:  Clear and Coherent and Slow  Volume:  Decreased  Mood:  Euthymic  Affect:  Appropriate and Congruent  Thought Process:  Coherent, Goal Directed, and Linear  Orientation:  Full (Time, Place, and Person)  Thought Content: WDL and Logical   Suicidal Thoughts:  No  Homicidal Thoughts:  No  Memory:  Immediate;   Good Recent;   Good Remote;   Good  Judgement:  Good  Insight:  Good  Psychomotor Activity:   Unable to assess due to telephone visit  Concentration:  Concentration: Good and Attention Span: Good  Recall:  Good  Fund of Knowledge: Good  Language: Good  Akathisia:   Unable to assess due to telephone visit  Handed:  Right  AIMS (if indicated): not done  Assets:  Communication Skills Desire for Improvement Financial Resources/Insurance Housing Leisure Time Social Support  ADL's:  Intact  Cognition: WNL  Sleep:  Good   Screenings: GAD-7    Flowsheet Row Video Visit from 01/19/2022 in [facility] Video Visit from 07/17/2021 in [facility] Video Visit from 02/08/2021 in [facility] Video Visit from 11/07/2020 in [facility]  Total GAD-7 Score '5 4 5 4      '$ PHQ2-9    Flowsheet Row Video Visit from 01/19/2022 in [facility] Video Visit from 07/17/2021 in [facility] Video Visit from 02/08/2021 in [facility] Video Visit from 11/07/2020 in [facility] Video Visit from 08/12/2020 in [facility]  PHQ-2 Total Score 2 0 '2 4 3   '$ PHQ-9 Total Score '13 3 4 9 15      '$ Flowsheet Row Video Visit from 08/12/2020 in [facility] ED from 07/26/2020 in [facility] from 07/19/2020 in [place] No Risk No Risk No Risk        Assessment and Plan: Patient endorses symptoms of ADHD.  She reports her mood, anxiety, and depression has improved since her hospitalization.Patient notes that she is interested in starting a medication to help manage these symptoms. Today Strattera 25 mg started. Patient will continue all other medications as prescribed.   1. GAD (generalized anxiety disorder)  Continue- busPIRone (BUSPAR) 5 MG tablet; Take 1 tablet (5 mg total) by mouth 3 (three) times daily.  Dispense: 90 tablet; Refill: 3 Continue- sertraline (ZOLOFT) 25 MG tablet; Take 3 tablets (75 mg total) by mouth daily.  Dispense: 45 tablet; Refill: 3  2. Mild depression  Continue- busPIRone (BUSPAR) 5 MG tablet; Take 1 tablet (5 mg total) by mouth 3 (three) times daily.  Dispense: 90 tablet; Refill: 3 Continue- sertraline (ZOLOFT) 25 MG tablet; Take 3 tablets (75 mg total) by mouth daily.  Dispense: 45 tablet; Refill: 3  3. Attention deficit hyperactivity disorder (ADHD), combined type  Continue- atomoxetine (STRATTERA) 25 MG capsule; Take 1 capsule (25 mg total) by mouth daily.  Dispense: 30 capsule; Refill: 3   Follow-up in 1 months  Salley Slaughter, NP 01/19/2022, 6:16 PM

## 2022-02-02 ENCOUNTER — Other Ambulatory Visit (HOSPITAL_COMMUNITY): Payer: Self-pay | Admitting: Psychiatry

## 2022-02-02 DIAGNOSIS — F902 Attention-deficit hyperactivity disorder, combined type: Secondary | ICD-10-CM

## 2022-02-02 DIAGNOSIS — F411 Generalized anxiety disorder: Secondary | ICD-10-CM

## 2022-02-02 DIAGNOSIS — F32A Depression, unspecified: Secondary | ICD-10-CM

## 2022-02-21 ENCOUNTER — Telehealth (HOSPITAL_COMMUNITY): Payer: Medicaid Other | Admitting: Psychiatry

## 2023-09-28 NOTE — ED Provider Notes (Signed)
 ------------------------------------------------------------------------------- Attestation signed by Augustin Dorothyann Eng, MD at 09/30/2023  7:20 PM _______________________________________________________________________ ATTENDING SUPERVISORY NOTE I have personally seen and examined the patient, and discussed the plan of care with the resident.   I have reviewed the documentation of the resident and agree.   Patient's presentation is most consistent with acute illness / injury with systematic symptoms.      -------------------------------------------------------------------------------  Emergency Department Provider Note  HPI and ROS  Chief Complaint:  Seizures (Possible seizure-like activity while being evaluated in MRI)   Candace King is a 23 y.o. year-old female with a PMH of depression, anxiety, FND, catatonia requiring hospitalization in 2023, ADHD, ASD (diagnosed at [facility] 2 years ago), auditory processing disorder who presented to the ED for altered mental status. History of Present Illness Patient's mother reports that patient has been having intermittent episodes of catatonia like behavior for the past several years.  She has had multiple hospitalizations, including in June of this year.  She notes that her symptoms tend to more during periods of increased stress.  Patient has been diagnosed with functional neurologic disorder and catatonia, though is still somewhat undifferentiated regarding other psychiatric diagnoses.  Mom states that patient has been continue to follow with psychiatry following her most recent admission.  She states she was trialed on one medication, however experienced severe side effects so this was discontinued.  At her follow-up virtual appointment with the psychiatrist, he expressed concern for possible hypomania versus psychosis, however she initially advised mom to pursue additional medical testing to rule out other neurologic or organic causes  particularly as he witnessed multiple episodes throughout the visit during which she appeared to stare off into space and become unresponsive for several seconds up to a minute before she would seem to shake her head or blink her eyes multiple times and become more responsive.  During one of these episodes she reportedly had a tear trail down her face but was otherwise unresponsive.   Mom does note extensive family history of seizures. Mom states that she brought patient to the hospital today for a scheduled outpatient MRI brain.  Following the scan, she received report from MRI staff that prior to IV placement patient became much less responsive and catatonic not dissimilar from prior episodes, however this reportedly persisted throughout the entirety of her MRI and afterwards which mom states was much more severe than prior episodes.  Given this worsening of her condition, mom decided to bring the patient to the ED for further evaluation.  She notes that during the 8 hours that they were in the ED waiting room, patient gradually returned to her baseline.  Mom otherwise denies any recent fevers, chills, cough,   Medical History[1] Surgical History[2] Family History[3] Social History   Socioeconomic History   Marital status: Single    Spouse name: Not on file   Number of children: Not on file   Years of education: Not on file   Highest education level: Not on file  Occupational History   Not on file  Tobacco Use   Smoking status: Never    Passive exposure: Yes   Smokeless tobacco: Never  Substance and Sexual Activity   Alcohol use: No   Drug use: No   Sexual activity: Not on file  Other Topics Concern   Not on file  Social History Narrative   She is currently in 10th grade   Social Drivers of Health   Food Insecurity: Low Risk  (07/09/2023)   Food  vital sign    Within the past 12 months, you worried that your food would run out before you got money to buy more: Never true     Within the past 12 months, the food you bought just didn't last and you didn't have money to get more: Never true  Transportation Needs: No Transportation Needs (07/09/2023)   Transportation    In the past 12 months, has lack of reliable transportation kept you from medical appointments, meetings, work or from getting things needed for daily living? : No  Safety: Low Risk  (07/09/2023)   Safety    How often does anyone, including family and friends, physically hurt you?: Never    How often does anyone, including family and friends, insult or talk down to you?: Never    How often does anyone, including family and friends, threaten you with harm?: Never    How often does anyone, including family and friends, scream or curse at you?: Never  Living Situation: Low Risk  (07/09/2023)   Living Situation    What is your living situation today?: I have a steady place to live    Think about the place you live. Do you have problems with any of the following? Choose all that apply:: None/None on this list     Medical Decision Making  Reviewed and confirmed nursing documentation for past medical history, family history, social history. History obtained from patient's mom, patient, review of prior records.  Patient is alert, afebrile, and hemodynamically stable in the ED in no acute distress. Physical exam as noted below, overall demonstrating flat affect with overall slowed psychomotor response and poor eye contact, however with no focal findings on neurologic exam.  Patient has very complex neuropsychiatric history as noted above and in numerous psychiatric notes.  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  These episodes with syncope.  No recent trauma.  Intracranial space-occupying lesion is possible although felt less likely.  Will contact radiology to facilitate more prompt read of MRI that patient had last night.   No indication for CT imaging of the head at this time.  Given patient did also have prior admission with electrolyte derangements, basic screening labs and EKG.  Per review of most recent psychiatry note, appears he also labs including HIV screening, RPR, heavy metal screening, ceruloplasmin, ANA.  Will obtain these today to facilitate workup.  Will additionally consult neurology for further recommendations regarding whether they believe patient requires admission for EEG or if she is appropriate for outpatient workup.  EKG demonstrates sinus rhythm with sinus arrhythmia, biatrial enlargement, rightward axis  Initial Study Results: Laboratory  All laboratory results reviewed without evidence of clinically relevant pathology.    Radiology All images reviewed independently. Agree with radiology report at this time.   Interventions and Interval History: EKG demonstrates sinus rhythm with sinus arrhythmia, biatrial enlargement, right axis deviation similar to prior.  QTc borderline mildly prolonged, however much improved from that prior to admission in 5/25.  Overall no significant EKG changes from prior.  No evidence of dysrhythmia.  Screening labs also reassuring with no leukocytosis, no anemia, normal creatinine and electrolytes.  Normal magnesium .  hCG negative.  Remainder of labs pending and likely will not result while patient in ED of which mom was made aware.  Radiology read patient's MRI and noted no abnormal findings.  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  She has had multiple normal spot EEGs over the past several years since she started having these episodes, further reassuring against epileptic activity.  They are recommending outpatient neurology follow-up and outpatient EEG.  Discussed these results with mom and patient at bedside.  They are comfortable with this plan.  Key medications administered in the ER: Medications - No data to  display   Disposition  Discharge: Patient is felt to be medically appropriate for discharge at this time. Patient was informed of all pertinent physical exam, laboratory, and imaging findings. Patients suspected etiology of their symptom presentation was discussed with the patient and all questions were answered. Patient was instructed to follow up with their primary care doctor in 3 days for re-evaluation and with necessary subspecialty follow up if needed. Patient was given strict return precautions.  The following medications were prescribed to patient upon discharge.  Discharge Medication List as of 09/28/2023 12:05 PM       Clinical Impression:  1. Abnormal behavior     ED Disposition     ED Disposition  Discharge   Condition  Stable   Comment  --         Physical Exam   Vitals:   09/27/23 2010 09/27/23 2011 09/28/23 0141 09/28/23 0535  BP:   114/82 107/82  BP Location:      Patient Position:   Sitting Sitting  Pulse:   (!) 118 80  Resp:   18 17  Temp:   98.2 F (36.8 C) 98.1 F (36.7 C)  TempSrc:   Oral Oral  SpO2:  100% 100% 100%  Weight: 54.4 kg (120 lb)     Height: 162.6 cm (5' 4)       Physical Exam:  General: NAD.  HEENT: Conjunctiva clear, no gross abnormalities, EOM intact  Cardio: Regular rate and rhythm. +S1/S2. No murmurs/rubs/gallops  Pulmonary: Lungs clear to auscultation bilaterally. No wheezes, rhonchi, rales.  Abdomen: Soft, not tender to palpation.  Extremities: No edema, clubbing, or cyanosis.  2+ pulses all 4 extremities. Neuro: Alert.  Frequently staring into space with generalized psychomotor slowing. PERRL.  EOMI without nystagmus.  Visual fields grossly intact.  Sensation intact to light touch across V1-V3 bilaterally.  Hearing intact to voice and finger rub.  Full and symmetric facial movements bilaterally.  Uvula midline with symmetric palatal rise.  Tongue protrudes midline with no fasciculations or atrophy.  Full strength of SCM and  trapezius muscles bilaterally.  5/5 strength in bilateral upper and lower extremities with sensation intact to light touch throughout.  Normal finger-to-nose.  Normal gait.  Psych: Flat affect.  Denying SI or HI.  Sometimes staring into space away from provider and mom for extended periods of time, however not obviously responding to internal stimuli    Procedure Note  Procedures   Clinical Complexity Patient's presentation is most consistent with acute presentation with potential threat to life or bodily function.  Discussed patient's care with providers from the following different specialties: Neurology  The patient was seen, evaluated, and treated in conjunction with Dr. Clemencia, who voiced agreement in the care provided  MDM generated using Dragon voice dictation software and may contain dictation errors. Please contact me for any clarification or with any questions.   Electronically signed by:  Laverda Karie Rimes, MD 09/28/2023 7:18 AM  Laverda Rimes, MD Emergency Medicine, PGY-3        [1] Past Medical History: Diagnosis Date   ADHD    Anxiety    Asthma (  CMD)    Autism spectrum disorder (CMD)    Depression    History of umbilical hernia    RESOLVED as CHILD per pediatrician   Irritable bowel syndrome    Migraine    Telangiectasia    Has been evaluated by geneticist  [2] Past Surgical History: Procedure Laterality Date   ADENOIDECTOMY     Procedure: ADENOIDECTOMY   ADENOIDECTOMY     Procedure: ADENOIDECTOMY   CLOSED REDUCTION NASAL FRACTURE  03/08/2013   Procedure: CLOSED REDUCTION NASAL FRACTURE   COLONOSCOPY N/A 12/23/2019   Procedure: COLONOSCOPY;  Surgeon: Selinda Alm Southgate, MD;  Location: [facility];  Service: Gastroenterology;  Laterality: N/A;   ESOPHAGOGASTRODUODENOSCOPY N/A 12/23/2019   Procedure: EGD;  Surgeon: Selinda Alm Southgate, MD;  Location: [facility];  Service: Gastroenterology;  Laterality: N/A;   NOSE  SURGERY     Procedure: NOSE SURGERY   TONSILLECTOMY AND ADENOIDECTOMY     Procedure: TONSILLECTOMY AND ADENOIDECTOMY  [3] Family History Problem Relation Name Age of Onset   Diverticulitis Maternal Grandmother     Heart disease Maternal Grandmother     Osteoporosis Maternal Grandmother     Arthritis Maternal Grandmother     Mental illness Maternal Grandmother     Lactose intolerance Cousin     Other Maternal Aunt         underwent surgery for gallbladder removal but was found to not have a gallbladder   Other Mother         genetic granulomatous eye problem   Hypertension Mother     Hyperlipidemia Father     Mental illness Father     Hypertension Maternal Grandfather     Kidney disease Maternal Grandfather     Stroke Paternal Grandmother     Breast cancer Paternal Grandmother     Ovarian cancer Paternal Grandmother     Hyperlipidemia Paternal Grandfather     Clotting disorder Neg Hx     Inflammatory bowel disease Neg Hx     Crohn's disease Neg Hx     Birth defects Neg Hx     Cholelithiasis Neg Hx     Hirschsprung's disease Neg Hx

## 2023-09-28 NOTE — ED Notes (Signed)
 Crackers and peanut butter give to patient's mother.  Mother was told specifically that she should not eat anything.  Mother gave pt crackers and peanut butter.     Katheryn Ole Ina, RN 09/28/23 [phone]

## 2024-03-11 ENCOUNTER — Other Ambulatory Visit: Payer: Self-pay

## 2024-03-11 ENCOUNTER — Emergency Department (HOSPITAL_BASED_OUTPATIENT_CLINIC_OR_DEPARTMENT_OTHER)
Admission: EM | Admit: 2024-03-11 | Discharge: 2024-03-11 | Disposition: A | Discharge status: Home / Self Care | Payer: MEDICAID | Attending: Emergency Medicine | Admitting: Emergency Medicine

## 2024-03-11 ENCOUNTER — Emergency Department (HOSPITAL_BASED_OUTPATIENT_CLINIC_OR_DEPARTMENT_OTHER): Payer: MEDICAID

## 2024-03-11 DIAGNOSIS — F444 Conversion disorder with motor symptom or deficit: Secondary | ICD-10-CM | POA: Insufficient documentation

## 2024-03-11 LAB — BASIC METABOLIC PANEL WITH GFR
Anion gap: 12 (ref 5–15)
BUN: 8 mg/dL (ref 6–20)
CO2: 24 mmol/L (ref 22–32)
Calcium: 9.2 mg/dL (ref 8.9–10.3)
Chloride: 103 mmol/L (ref 98–111)
Creatinine, Ser: 0.67 mg/dL (ref 0.44–1.00)
GFR, Estimated: >60 mL/min
Glucose, Bld: 101 mg/dL — ABNORMAL HIGH (ref 70–99)
Potassium: 4.3 mmol/L (ref 3.5–5.1)
Sodium: 139 mmol/L (ref 135–145)

## 2024-03-11 LAB — CBC WITH DIFFERENTIAL/PLATELET
Abs Immature Granulocytes: 0.02 10*3/uL (ref 0.00–0.07)
Basophils Absolute: 0 10*3/uL (ref 0.0–0.1)
Basophils Relative: 1 %
Eosinophils Absolute: 0 10*3/uL (ref 0.0–0.5)
Eosinophils Relative: 0 %
HCT: 41.1 % (ref 36.0–46.0)
Hemoglobin: 14.3 g/dL (ref 12.0–15.0)
Immature Granulocytes: 0 %
Lymphocytes Relative: 15 %
Lymphs Abs: 0.9 10*3/uL (ref 0.7–4.0)
MCH: 32.4 pg (ref 26.0–34.0)
MCHC: 34.8 g/dL (ref 30.0–36.0)
MCV: 93 fL (ref 80.0–100.0)
Monocytes Absolute: 0.5 10*3/uL (ref 0.1–1.0)
Monocytes Relative: 8 %
Neutro Abs: 5 10*3/uL (ref 1.7–7.7)
Neutrophils Relative %: 76 %
Platelets: 232 10*3/uL (ref 150–400)
RBC: 4.42 MIL/uL (ref 3.87–5.11)
RDW: 12.8 % (ref 11.5–15.5)
WBC: 6.5 10*3/uL (ref 4.0–10.5)
nRBC: 0 % (ref 0.0–0.2)

## 2024-03-11 LAB — HCG, QUANTITATIVE, PREGNANCY: hCG, Beta Chain, Quant, S: <1 m[IU]/mL

## 2024-03-11 MED ORDER — OXYCODONE-ACETAMINOPHEN 5-325 MG PO TABS
1.0000 | ORAL_TABLET | Freq: Once | ORAL | Status: AC
  Administered 2024-03-11: 1 via ORAL
  Filled 2024-03-11: qty 1

## 2024-03-11 MED ORDER — OXYCODONE HCL 5 MG PO TABS: 5.0000 mg | ORAL_TABLET | Freq: Four times a day (QID) | ORAL | 0 refills | Status: DC | PRN

## 2024-03-11 MED ORDER — OXYCODONE HCL 5 MG PO TABS: 5.0000 mg | ORAL_TABLET | Freq: Four times a day (QID) | ORAL | 0 refills | Status: AC | PRN

## 2024-03-11 NOTE — ED Notes (Signed)
 Patient returned from US . Unable to complete study. Patient scream when touched with US  probe, also reported screaming after when not being touched. Provided notified.

## 2024-03-11 NOTE — Progress Notes (Signed)
 [facility] [place]  Return Patient Visit Candace King DOB: 2000/07/30  MRN: [id number] Visit Date: 03/11/2024  Encounter Provider: Laneta Tanda Agent, PA-C  Subjective Candace King is a 24 y.o. female who presents for Edema (Bilateral lower extremities/Compression socks, elevation, and Ibuprofen /Started Jan 29th/Had to take extra doses of Guanfacin on 02/29/24 and 03/03/24) History of Present Illness The patient is a 24 year old female who presents for evaluation of leg swelling.  She reports experiencing bilateral leg swelling, which she attributes to an increased dosage of guanfacine taken a few days prior. The swelling began on 03/05/2024, two days after the additional dose. She took an additional dose of guanfacine on 02/29/24 and 03/03/24. She typically takes guanfacine nightly and is also prescribed 1 mg daily PRN, but this is the first time she has taken daily PRN dose. She describes significant pain in her legs, which intensifies as the day progresses. Despite wearing compression socks on 03/05/2024 and 03/06/2024, she experienced difficulty lifting her left leg due to the swelling. She also reported numbness and feeling of thickness in her legs, which impeded her mobility.   On 03/06/2024, she experienced dizziness that was severe enough to prevent her from standing or opening a bottle of water. She reports no chest pain or shortness of breath. This is her first experience with these symptoms following an extra dose of guanfacine. She has been elevating her legs using pillows and notes that her legs become more painful when she drinks water. She describes her legs as feeling thick and stiff, making walking difficult. Her urinary frequency remains unchanged, and her urine is light yellow in color.   She reports no recent fevers or chills. She had a headache yesterday, which was not severe. She has not experienced a headache this year until yesterday. She  reports no new medications. She has a history of catatonic spells but does not believe her current symptoms are related. She reports pain in both legs, particularly in the back of her calves. She is currently on guanfacine for ADHD management, with a nightly dose of 1 mg and an additional 1 mg as needed for mood symptoms. She has not required any extra doses since 03/03/2024.   Objective Blood pressure 122/88, pulse (!) 119, temperature 98.2 F (36.8 C), resp. rate 16, height 1.626 m (5' 4), weight 62.4 kg (137 lb 9.6 oz), SpO2 99%. Physical Exam   Physical Exam Constitutional:      General: She is not in acute distress.    Appearance: Normal appearance. She is not ill-appearing, toxic-appearing or diaphoretic.  HENT:     Head: Normocephalic and atraumatic.     Right Ear: External ear normal.     Left Ear: External ear normal.     Nose: Nose normal.  Eyes:     Extraocular Movements: Extraocular movements intact.  Cardiovascular:     Rate and Rhythm: Normal rate and regular rhythm.     Heart sounds: Normal heart sounds. No murmur heard.    No friction rub. No gallop.  Pulmonary:     Effort: Pulmonary effort is normal. No respiratory distress.     Breath sounds: Normal breath sounds. No stridor. No wheezing, rhonchi or rales.  Musculoskeletal:        General: Tenderness (tenderness to bilateral lower extremities, particularly in posterior lower leg; no point tenderness, redness, edema, warmth, or induration) present.     Cervical back: Normal range of motion and neck supple.     Right  lower leg: No edema.     Left lower leg: No edema.  Skin:    General: Skin is warm and dry.     Capillary Refill: Capillary refill takes less than 2 seconds.  Neurological:     General: No focal deficit present.     Mental Status: She is alert and oriented to person, place, and time. Mental status is at baseline.  Psychiatric:        Mood and Affect: Mood normal.        Behavior: Behavior normal.         Thought Content: Thought content normal.        Judgment: Judgment normal.    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   Memory difficulty 04/11/2020   Cognitive complaints 04/11/2020   Auditory processing disorder 10/20/2019   Asthma 10/20/2019   Low back pain 05/25/2019   Cervicalgia 12/24/2018   Postconcussion syndrome 12/24/2018   Intractable migraine without aura and without status migrainosus 06/20/2016   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   Abdominal pain 05/01/2013   Reflux 05/01/2013   Depression 11/26/2012   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   Current Medications:  Medications Ordered Prior to Encounter[2] Current Medications[3]  Allergies: Allergies[4]   Immunizations:  Immunization History  Administered Date(s) Administered   Dtap, Unspecified 10/23/1999, 01/08/2000, 02/26/2000, 12/11/2000, 08/18/2004   Hep B, Unspecified 05/14/1999, 10/23/1999, 02/26/2000   Hepatitis A pediatric/adolescent (VAQTA PEDS) 1Y-18Y 08/28/2006, 11/10/2008   HiB, Unspecified 10/23/1999, 12/25/1999, 02/26/2000, 12/11/2000   Influenza, Injectable, Quadrivalent, Preservative Free 11/10/2011, 12/31/2012, 11/14/2013, 11/11/2014, 03/08/2016, 10/10/2016, 10/28/2017, 11/10/2018, 10/28/2019, 11/22/2020, 11/02/2021   Influenza, Unspecified 12/11/2000, 01/10/2001, 12/12/2001, 11/06/2002, 11/22/2006, 10/30/2007, 01/14/2008, 10/21/2008, 02/14/2010, 11/15/2010   Influenza,split virus, trivalent, PF 11/22/2006, 10/30/2007, 10/21/2008, 02/14/2010, 11/15/2010, 12/10/2023   MMR 09/18/2000,  08/23/2003   Meningococcal B (BEXSERO) 10Y+ 09/30/2017   Meningococcal MCV4P 09/19/2010, 09/30/2017   Novel Influenza H1n1-09 12/13/2007   Pfizer SARS-CoV-2 Bivalent 12+ yrs 11/30/2020   Pfizer SARS-CoV-2 Primary Series 12+ yrs 05/01/2019, 05/01/2019, 05/22/2019, 05/22/2019, 11/11/2019, 11/14/2019   Polio, Unspecified 10/23/1999, 12/25/1999, 05/31/2000, 08/21/2004   TDAP VACCINE (BOOSTRIX,ADACEL) 7Y+ 09/19/2010   Varicella SQ (VARIVAX) 1Y+ 09/18/2000, 10/22/2005    Surgical History- Surgical History[5]  Family history- Family History[6] Social history- Social History[7]   Labs: No results found for this or any previous visit (from the past week).    Assessment & Plan 1. Leg swelling (Primary) 2. Pain in both lower legs - The patient reports significant leg swelling and pain, which worsens throughout the day. - The swelling has improved and no swelling is present on my exam, but the pain remains severe.  - Blood work will be conducted to check electrolytes and kidney function. A bilateral venous ultrasound will be ordered to rule out blood clots. - Compression socks, leg elevation, acetaminophen  or Tylenol  for pain, increased water intake, reduced salt consumption, and rest are recommended. Immediate medical attention is advised if symptoms worsen or if chest pain, shortness of breath, heart palpitations, or severe persistent dizziness occur. - CBC with Differential - Comprehensive Metabolic Panel - US  Peripheral Venous Legs; Future - B-Type Natriuretic Peptide (BNP)     Problem List Items Addressed  This Visit   None Visit Diagnoses       Leg swelling    -  Primary   Relevant Orders   CBC with Differential   Comprehensive Metabolic Panel   US  Peripheral Venous Legs   B-Type Natriuretic Peptide (BNP)     Pain in both lower legs       Relevant Orders   CBC with Differential   Comprehensive Metabolic Panel   US  Peripheral Venous Legs   B-Type Natriuretic Peptide  (BNP)       Monitor: The problem is newly identified Evaluation: Labs/tests ordered, see encounter summary Assessment/Treatment PRN  Please contact my office for worsening conditions or problems, and seek emergency medical treatment and/or call 911 if you or your family deems either necessary.  Patient Instructions  Treatment Plan: Blood work to check electrolytes and kidney function. Bilateral venous ultrasound to rule out blood clots. Use compression socks and elevate legs using pillows. Take acetaminophen /Tylenol  for pain. Increase water intake and reduce salt consumption. Rest and avoid strenuous activities.   Follow-Up Instructions: Seek immediate medical attention if symptoms worsen or if chest pain, shortness of breath, heart palpitations, or severe persistent dizziness occur.  Return if symptoms worsen or fail to improve.  Portions of this note were created using [employer] Copilot software. Although proofread, errors may be present.  I have personally spent 30 minutes involved in face-to-face and non-face-to-face activities for this patient on the day of the visit.  Professional time spent includes the following activities, in addition to those noted in the documentation:  - preparing to see the patient (e.g., review of recent and/or remote lab/imaging/study results, provider notes, and patient messages/phone calls available in current EMR, CareEverywhere, and scanned records) -obtaining and/or reviewing separately obtained history either through past provider notes, patient phone calls, and/or patient's family member(s)/caregiver(s) -performing a medically appropriate examination and/or evaluation -counseling and educating the patient/family/caregiver -ordering medications, tests, or procedures -documenting clinical information in the electronic or other health record -reviewing most up to date studies or expert consensus guidelines for screening/diagnosing/treating pertinent  conditions/symptoms -independently interpreting results (not separately reported) and communicating results to the patient/family/caregiver -care coordination (not separately reported) -referring and communicating with other health care professionals (when not separately reported)  Laneta Tanda Agent, PA-C 11:14 AM      [1] Past Medical History: Diagnosis Date   ADHD    Anxiety    Asthma (CMD)    Autism spectrum disorder (CMD)    Depression    History of umbilical hernia    RESOLVED as CHILD per pediatrician   Irritable bowel syndrome    Migraine    Telangiectasia    Has been evaluated by geneticist  [2] Current Outpatient Medications on File Prior to Visit  Medication Sig Dispense Refill   guanFACINE (TENEX) 1 mg tablet Take 1 tablet (1 mg total) by mouth at bedtime. May also take 1 tablet (1 mg total) daily as needed (Anxiety). 90 tablet 1   levonorgestreL  (Mirena ) 21 mcg/24 hours (8 yrs) 52 mg IUD Mirena  20 mcg/24 hours (6 yrs) 52 mg intrauterine device Take 1 device by intrauterine route.     [DISCONTINUED] albuterol  HFA (PROVENTIL  HFA;VENTOLIN  HFA;PROAIR  HFA) 90 mcg/actuation inhaler Inhale 2 puffs every 4 (four) hours as needed for wheezing or shortness of breath. (Patient not taking: Reported on 03/11/2024) 2 each 3   [DISCONTINUED] b complex vitamins tab tablet Take 1 tablet by mouth Once Daily. (Patient not taking: Reported on 03/11/2024)     [  DISCONTINUED] beclomethasone dipropionate  (Qvar  RediHaler) 80 mcg/actuation Inhale 2 puffs 2 (two) times a day. (Patient not taking: Reported on 03/11/2024) 3 each 4   [DISCONTINUED] dextromethorphan-guaiFENesin (ROBITUSSIN-DM) 10-100 mg/5 mL liquid Take 10 mL by mouth every 4 (four) hours as needed for cough. (Patient not taking: Reported on 03/11/2024)     [DISCONTINUED] glycopyrronium tosylate (Qbrexza) 2.4 % towl Apply 1 Application topically Once Daily. (Patient not taking: Reported on 03/11/2024) 30 each 2    [DISCONTINUED] melatonin 3 mg tablet Take 0.5 tablets (1.5 mg total) by mouth nightly as needed for sleep. (Patient not taking: Reported on 03/11/2024)     [DISCONTINUED] SUMAtriptan (IMITREX) 50 mg tablet Take 50 mg by mouth as needed for migraine. (Patient not taking: Reported on 03/11/2024) 30 tablet 3   No current facility-administered medications on file prior to visit.  [3]  Current Outpatient Medications:    guanFACINE (TENEX) 1 mg tablet, Take 1 tablet (1 mg total) by mouth at bedtime. May also take 1 tablet (1 mg total) daily as needed (Anxiety)., Disp: 90 tablet, Rfl: 1   levonorgestreL  (Mirena ) 21 mcg/24 hours (8 yrs) 52 mg IUD, Mirena  20 mcg/24 hours (6 yrs) 52 mg intrauterine device Take 1 device by intrauterine route., Disp: , Rfl:  [4] Allergies Allergen Reactions   Amoxicillin-Pot Clavulanate Diarrhea   Cat Dander    Dog Dander    Feathers    Gabapentin Other (See Comments)    hyper   House Dust Mite    Mold    Morphine  Other (See Comments)    Shaky/hot/cold   Opioids - Morphine  Analogues     Metal taste   Topiramate Dizziness    Memory loss   [5] Past Surgical History: Procedure Laterality Date   ADENOIDECTOMY     Procedure: ADENOIDECTOMY   ADENOIDECTOMY     Procedure: ADENOIDECTOMY   CLOSED REDUCTION NASAL FRACTURE  03/08/2013   Procedure: CLOSED REDUCTION NASAL FRACTURE   COLONOSCOPY N/A 12/23/2019   Procedure: COLONOSCOPY;  Surgeon: Selinda Alm Southgate, MD;  Location: [facility];  Service: Gastroenterology;  Laterality: N/A;   ESOPHAGOGASTRODUODENOSCOPY N/A 12/23/2019   Procedure: EGD;  Surgeon: Selinda Alm Southgate, MD;  Location: [facility];  Service: Gastroenterology;  Laterality: N/A;   NOSE SURGERY     Procedure: NOSE SURGERY   TONSILLECTOMY AND ADENOIDECTOMY     Procedure: TONSILLECTOMY AND ADENOIDECTOMY  [6] Family History Problem Relation Name Age of Onset   Other Mother         genetic granulomatous eye problem    Hypertension Mother     Hyperlipidemia Father     Mental illness Father     Other Maternal Aunt         underwent surgery for gallbladder removal but was found to not have a gallbladder   Diverticulitis Maternal Grandmother     Heart disease Maternal Grandmother     Osteoporosis Maternal Grandmother     Arthritis Maternal Grandmother     Mental illness Maternal Grandmother     Hypertension Maternal Grandfather     Kidney disease Maternal Grandfather     Stroke Paternal Grandmother     Breast cancer Paternal Grandmother     Ovarian cancer Paternal Grandmother     Hyperlipidemia Paternal Grandfather     Peripheral arterial disease Paternal Grandfather     Lactose intolerance Cousin     Clotting disorder Neg Hx     Inflammatory bowel disease Neg Hx     Crohn's disease  Neg Hx     Birth defects Neg Hx     Cholelithiasis Neg Hx     Hirschsprung's disease Neg Hx    [7] Social History Socioeconomic History   Marital status: Single  Tobacco Use   Smoking status: Never    Passive exposure: Yes   Smokeless tobacco: Never  Substance and Sexual Activity   Alcohol use: No   Drug use: No  Social History Narrative   She is currently in 10th grade   Social Drivers of Health   Living Situation: Low Risk (03/11/2024)   Living Situation    What is your living situation today?: I have a steady place to live    Think about the place you live. Do you have problems with any of the following? Choose all that apply:: None/None on this list  Food Insecurity: Low Risk (03/11/2024)   Food vital sign    Within the past 12 months, you worried that your food would run out before you got money to buy more: Never true    Within the past 12 months, the food you bought just didn't last and you didn't have money to get more: Never true  Transportation Needs: No Transportation Needs (03/11/2024)   Transportation    In the past 12 months, has lack of reliable transportation kept  you from medical appointments, meetings, work or from getting things needed for daily living? : No  Utilities: Low Risk (03/11/2024)   Utilities    In the past 12 months has the electric, gas, oil, or water company threatened to shut off services in your home? : No  Safety: Low Risk (03/11/2024)   Safety    How often does anyone, including family and friends, physically hurt you?: Never    How often does anyone, including family and friends, insult or talk down to you?: Never    How often does anyone, including family and friends, threaten you with harm?: Never    How often does anyone, including family and friends, scream or curse at you?: Never  Alcohol Screening: Not At Risk (07/11/2023)   Alcohol    Audit C Alcohol risk score: 0  Tobacco Use: Medium Risk (03/11/2024)   Patient History    Smoking Tobacco Use: Never    Smokeless Tobacco Use: Never    Passive Exposure: Yes  Depression: Not At Risk (08/15/2023)   PHQ-2    PHQ-2 Score: 0  Social Connections: Unknown (06/19/2021)   Received from [facility]   Social Network    Social Network: Not on file

## 2024-03-11 NOTE — ED Notes (Signed)
 Patient transported to Ultrasound

## 2024-03-11 NOTE — ED Notes (Signed)
 Patient reports additional pain when ever someone touches the stretcher she is on. Pain meds given, testing delayed until controlled pain control

## 2024-03-11 NOTE — ED Triage Notes (Signed)
 BIB [facility] for bilateral leg pain. States has hx of functional neurological disorder   Increased leg swelling and pain form knees to feet since 1/27. Has PRN medications for disorder that are not helping.

## 2024-03-11 NOTE — ED Provider Notes (Signed)
 " [place] [facility] AT [facility] Provider Note   CSN: [id number] Arrival date & time: 03/11/24  1239     Patient presents with: Leg Pain   Candace King is a 24 y.o. female.  24 year old female presents to the emergency department via EMS from PCP.  Patient is reporting sudden onset of severe bilateral calf pain and loss of function of ambulation.  At this time PCP called EMS to bring to emergency department for evaluation.  Patient was being seen by PCP for concerns of bilateral leg swelling which she believed was secondary to the guanfacine.  PCP ordered DVT rule out at this time.  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  Patient reports taking numerous anxiety depression meds.  She reports she was admitted last year for functional neurological disorder where she lost ability to walk, eat, use the restroom and spent 2 months in rehab regaining ADLs.  She has seen multiple neurologist psychiatrist and neuropsychiatrist since then.  Patient is concerned today for the swelling in her legs and sudden onset of pain in her bilateral calfs.     Prior to Admission medications  Medication Sig Start Date End Date Taking? Authorizing Provider  albuterol  (PROVENTIL  HFA;VENTOLIN  HFA) 108 (90 Base) MCG/ACT inhaler Inhale 1-2 puffs into the lungs every 6 (six) hours as needed for wheezing or shortness of breath.    [provider]  atomoxetine  (STRATTERA ) 25 MG capsule TAKE 1 CAPSULE BY MOUTH EVERY DAY 02/04/22   Parsons, Brittney E, NP  busPIRone  (BUSPAR ) 5 MG tablet TAKE 1 TABLET BY MOUTH THREE TIMES A DAY 02/04/22   Parsons, Brittney E, NP  ipratropium (ATROVENT) 0.03 % nasal spray Place 1 spray into both nostrils. 07/06/20   [provider]  Naphazoline HCl (CLEAR EYES OP) Place 1 drop into both eyes daily as needed (For dry eyes).    [provider]  oxyCODONE  (ROXICODONE ) 5 MG immediate release tablet  Take 1 tablet (5 mg total) by mouth every 6 (six) hours as needed for severe pain (pain score 7-10). 03/11/24   Minnie Tinnie BRAVO, PA  pantoprazole  (PROTONIX ) 40 MG tablet Take 1 tablet (40 mg total) by mouth 2 (two) times daily. 08/16/19 07/26/20  Milissa Tod PARAS, MD  polyethylene glycol (MIRALAX  / GLYCOLAX ) 17 g packet Take 17 g by mouth daily as needed for mild constipation. 08/16/19   Milissa Tod PARAS, MD  Probiotic Product (PROBIOTIC PO) Take 1 tablet by mouth daily.    [provider]  QVAR  REDIHALER 80 MCG/ACT inhaler Inhale 1 puff into the lungs 2 (two) times daily.  01/14/18   [provider]  sertraline  (ZOLOFT ) 25 MG tablet TAKE 3 TABLETS BY MOUTH DAILY 02/04/22   Parsons, Brittney E, NP  SUMAtriptan (IMITREX) 50 MG tablet Take 1 tablet by mouth as needed for migraine.    [provider]  Vitamin D, Ergocalciferol, (DRISDOL) 1.25 MG (50000 UNIT) CAPS capsule Take 1 capsule by mouth once a week. 04/26/20   [provider]    Allergies: Augmentin [amoxicillin-pot clavulanate], Gabapentin, Morphine  and codeine, and Topiramate    Review of Systems  Cardiovascular:  Positive for leg swelling.  Musculoskeletal:  Positive for myalgias.  All other systems reviewed and are negative.   Updated Vital Signs BP 135/89   Pulse 100   Temp 97.9 F (36.6 C) (Oral)   Resp 16   SpO2 95%   Physical Exam Vitals and nursing note reviewed.  Constitutional:      General: She is not in acute distress.    Appearance: Normal appearance. She is not ill-appearing or toxic-appearing.  HENT:     Head: Normocephalic and atraumatic.     Nose: Nose normal.  Eyes:     Extraocular Movements: Extraocular movements intact.     Conjunctiva/sclera: Conjunctivae normal.     Pupils: Pupils are equal, round, and reactive to light.  Cardiovascular:     Rate and Rhythm: Normal rate.     Heart sounds: Normal heart sounds.  Pulmonary:     Effort: Pulmonary effort is normal. No  respiratory distress.     Breath sounds: Normal breath sounds.  Abdominal:     General: Abdomen is flat.     Tenderness: There is no abdominal tenderness.  Musculoskeletal:        General: Normal range of motion.     Cervical back: Normal range of motion.     Comments: No pitting edema noted bilaterally.  Family reports that her legs are more swollen than normal.  Patient initially was able to move her ankles bilaterally later in the exam she was unable to move her ankles.  Patient reports having full sensation throughout the extremity.  Patient reports extreme pain to palpation of bilateral calves.  No pain to palpation throughout knees.  Pulses are present.  Good cap refill.  All joints are able to be moved passively, no laxity noted.  All compartments are pliable.  Skin:    Capillary Refill: Capillary refill takes less than 2 seconds.  Neurological:     Mental Status: She is alert.  Psychiatric:        Mood and Affect: Mood normal.        Behavior: Behavior normal.     (all labs ordered are listed, but only abnormal results are displayed) Labs Reviewed  BASIC METABOLIC PANEL WITH GFR - Abnormal; Notable for the following components:      Result Value   Glucose, Bld 101 (*)    All other components within normal limits  CBC WITH DIFFERENTIAL/PLATELET  HCG, QUANTITATIVE, PREGNANCY    EKG: None  Radiology: US  Venous Img Lower Bilateral Result Date: 03/11/2024 CLINICAL DATA:  Bilateral lower extremity edema and pain EXAM: BILATERAL LOWER EXTREMITY VENOUS DOPPLER ULTRASOUND TECHNIQUE: Gray-scale sonography with graded compression, as well as color Doppler and duplex ultrasound were performed to evaluate the lower extremity deep venous systems from the level of the common femoral vein and including the common femoral, femoral, profunda femoral, popliteal and calf veins including the posterior tibial, peroneal and gastrocnemius veins when visible. The superficial great saphenous vein  was also interrogated. Spectral Doppler was utilized to evaluate flow at rest and with distal augmentation maneuvers in the common femoral, femoral and popliteal veins. COMPARISON:  None Available. FINDINGS: Overall limited exam because of patient condition, associated pain, immobility, history of functional neurologic disorder RIGHT LOWER EXTREMITY Common Femoral Vein: No evidence of thrombus. Normal compressibility, respiratory phasicity and response to augmentation. Saphenofemoral Junction: No evidence of thrombus. Normal compressibility and flow on color Doppler imaging. Profunda Femoral Vein: No evidence of thrombus. Normal compressibility and flow on color Doppler imaging. Femoral Vein: No evidence of thrombus. Normal compressibility, respiratory phasicity and response to augmentation. Popliteal Vein: No evidence of thrombus. Normal compressibility, respiratory phasicity and response to augmentation. Calf Veins: No evidence of thrombus. Normal compressibility and flow on color Doppler imaging. Superficial Great Saphenous Vein: No evidence of thrombus. Normal compressibility. LEFT LOWER  EXTREMITY Common Femoral Vein: No evidence of thrombus. Normal compressibility, respiratory phasicity and response to augmentation. Saphenofemoral Junction: No evidence of thrombus. Normal compressibility and flow on color Doppler imaging. Profunda Femoral Vein: No evidence of thrombus. Normal compressibility and flow on color Doppler imaging. Femoral Vein: No evidence of thrombus. Normal compressibility, respiratory phasicity and response to augmentation. Popliteal Vein: No evidence of thrombus. Normal compressibility, respiratory phasicity and response to augmentation. Calf Veins: No evidence of thrombus. Normal compressibility and flow on color Doppler imaging. Superficial Great Saphenous Vein: No evidence of thrombus. Normal compressibility. IMPRESSION: Limited exam as noted above. No evidence of significant occlusive deep  venous thrombosis in either lower extremity. Electronically Signed   By: CHRISTELLA.  Shick M.D.   On: 03/11/2024 16:52     Procedures   Medications Ordered in the ED  oxyCODONE -acetaminophen  (PERCOCET/ROXICET) 5-325 MG per tablet 1 tablet (1 tablet Oral Given 03/11/24 1449)  oxyCODONE -acetaminophen  (PERCOCET/ROXICET) 5-325 MG per tablet 1 tablet (1 tablet Oral Given 03/11/24 1912)    24 y.o. female presents to the ED with complaints of pain to bilateral calves,  The differential diagnosis includes functional neurological disorder, electrolyte abnormality, anemia, DVT, musculoskeletal strain (Ddx)  On arrival pt is nontoxic, vitals unremarkable. Exam significant for pain to the bilateral calves  Additional history obtained from chart review significant for patient being admitted to [facility] for FND and placed in rehab for ADLs.  I ordered medication oxycodone  for pain  Lab Tests:  CBC BMP hCG all unremarkable  Imaging Studies ordered:  I ordered imaging studies which included ultrasound DVT rule out which was negative.  ED Course:   On initial exam patient was able to actively flex and extend bilateral ankles.  Later in the exam she was unable to move them at all.  Patient is reporting significant pain in her calves especially to palpation.  Patient has full range of motion passively to the knees and ankles.  With patient's history and PCP already ordering DVT rule out for concerns of DVTs.  Will order DVT rule out at this time.  Review of previous notes showed patient has significant history of similar deficits that have taken weeks to resolve.  Recent psychiatry note noted they do not believe the swelling is secondary to guanfacine.  Patient was unable to tolerate ultrasound due to pain in calf.  Patient will be given oxycodone  for pain management.  Patient was able to tolerate ultrasound at that time.  Ultrasound was negative.  Dr. Voncile was consulted with neurology and he advised that he would  recommend following up with psychiatry for further recommendation.  And he would also recommend patient being seen at [facility] where her established providers are already established that know her medical history and medication regimen.  Patient was advised of findings.  Patient reports she has a walker, cane, wheelchair at home to help with ambulation.  Patient was ambulated in the emergency department with a walker.  She was advised she would be given a short course of pain medication and advised to follow-up with psychiatry and PCP tomorrow for further recommendation.  All questions were answered at bedside.  Patient was advised of risk of opioid pain management use, patient and mother advised they understand risks and agreed to take it only as prescribed.  Patient and mother agreed to treatment plan and were comfortable discharge at this time.   Portions of this note were generated with Scientist, clinical (histocompatibility and immunogenetics). Dictation errors may occur despite best attempts at proofreading.  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

## 2024-03-11 NOTE — Discharge Instructions (Addendum)
 Please follow-up with primary care and psychiatry as soon as possible for further evaluation and recommendation.  Please use Tylenol  and ibuprofen  as needed for pain I will send in a short course of pain medication.  Monitor for any concerning new or worsening symptoms if anything arises please return to emergency department for further evaluation.
